# Patient Record
Sex: Female | Born: 1935 | Race: Black or African American | Hispanic: No | Smoking: Former smoker
Health system: Southern US, Community
[De-identification: ages and names within clinical notes are randomized; demographics above are authoritative.]

## PROBLEM LIST (undated history)

## (undated) DIAGNOSIS — I509 Heart failure, unspecified: Secondary | ICD-10-CM

## (undated) DIAGNOSIS — E559 Vitamin D deficiency, unspecified: Secondary | ICD-10-CM

## (undated) DIAGNOSIS — E119 Type 2 diabetes mellitus without complications: Secondary | ICD-10-CM

## (undated) DIAGNOSIS — I10 Essential (primary) hypertension: Secondary | ICD-10-CM

## (undated) DIAGNOSIS — F419 Anxiety disorder, unspecified: Secondary | ICD-10-CM

## (undated) DIAGNOSIS — L8990 Pressure ulcer of unspecified site, unspecified stage: Secondary | ICD-10-CM

## (undated) DIAGNOSIS — E039 Hypothyroidism, unspecified: Secondary | ICD-10-CM

## (undated) DIAGNOSIS — C50919 Malignant neoplasm of unspecified site of unspecified female breast: Secondary | ICD-10-CM

## (undated) DIAGNOSIS — M1990 Unspecified osteoarthritis, unspecified site: Secondary | ICD-10-CM

## (undated) DIAGNOSIS — R350 Frequency of micturition: Secondary | ICD-10-CM

## (undated) HISTORY — DX: Vitamin D deficiency, unspecified: E55.9

## (undated) HISTORY — DX: Unspecified osteoarthritis, unspecified site: M19.90

## (undated) HISTORY — PX: ABDOMINAL HYSTERECTOMY: SHX81

## (undated) HISTORY — PX: CHOLECYSTECTOMY: SHX55

## (undated) HISTORY — PX: BREAST LUMPECTOMY: SHX2

## (undated) HISTORY — DX: Heart failure, unspecified: I50.9

## (undated) HISTORY — DX: Anxiety disorder, unspecified: F41.9

## (undated) HISTORY — PX: CATARACT EXTRACTION: SUR2

## (undated) HISTORY — DX: Hypothyroidism, unspecified: E03.9

## (undated) HISTORY — DX: Frequency of micturition: R35.0

## (undated) HISTORY — DX: Essential (primary) hypertension: I10

---

## 1997-09-13 ENCOUNTER — Ambulatory Visit (HOSPITAL_COMMUNITY): Admission: RE | Admit: 1997-09-13 | Discharge: 1997-09-13 | Payer: Self-pay | Admitting: Internal Medicine

## 1997-09-21 ENCOUNTER — Ambulatory Visit (HOSPITAL_COMMUNITY): Admission: RE | Admit: 1997-09-21 | Discharge: 1997-09-21 | Payer: Self-pay | Admitting: Internal Medicine

## 1997-10-06 ENCOUNTER — Ambulatory Visit (HOSPITAL_COMMUNITY): Admission: RE | Admit: 1997-10-06 | Discharge: 1997-10-06 | Payer: Self-pay | Admitting: General Surgery

## 1997-10-20 ENCOUNTER — Ambulatory Visit (HOSPITAL_COMMUNITY): Admission: RE | Admit: 1997-10-20 | Discharge: 1997-10-20 | Payer: Self-pay | Admitting: *Deleted

## 1997-10-27 ENCOUNTER — Ambulatory Visit (HOSPITAL_COMMUNITY): Admission: RE | Admit: 1997-10-27 | Discharge: 1997-10-27 | Payer: Self-pay | Admitting: *Deleted

## 1997-11-13 ENCOUNTER — Ambulatory Visit (HOSPITAL_COMMUNITY): Admission: RE | Admit: 1997-11-13 | Discharge: 1997-11-13 | Payer: Self-pay | Admitting: General Surgery

## 1997-12-04 ENCOUNTER — Encounter: Admission: RE | Admit: 1997-12-04 | Discharge: 1998-03-04 | Payer: Self-pay | Admitting: Radiation Oncology

## 1997-12-19 ENCOUNTER — Emergency Department (HOSPITAL_COMMUNITY): Admission: EM | Admit: 1997-12-19 | Discharge: 1997-12-19 | Payer: Self-pay | Admitting: *Deleted

## 1997-12-19 ENCOUNTER — Encounter: Payer: Self-pay | Admitting: *Deleted

## 1999-02-20 ENCOUNTER — Encounter: Payer: Self-pay | Admitting: Internal Medicine

## 1999-02-20 ENCOUNTER — Ambulatory Visit (HOSPITAL_COMMUNITY): Admission: RE | Admit: 1999-02-20 | Discharge: 1999-02-20 | Payer: Self-pay | Admitting: Internal Medicine

## 1999-06-14 ENCOUNTER — Ambulatory Visit (HOSPITAL_COMMUNITY): Admission: RE | Admit: 1999-06-14 | Discharge: 1999-06-14 | Payer: Self-pay | Admitting: Internal Medicine

## 1999-07-09 ENCOUNTER — Encounter: Admission: RE | Admit: 1999-07-09 | Discharge: 1999-07-09 | Payer: Self-pay | Admitting: Internal Medicine

## 1999-07-09 ENCOUNTER — Encounter: Payer: Self-pay | Admitting: Internal Medicine

## 1999-09-05 ENCOUNTER — Encounter: Payer: Self-pay | Admitting: Internal Medicine

## 1999-09-05 ENCOUNTER — Inpatient Hospital Stay (HOSPITAL_COMMUNITY): Admission: AD | Admit: 1999-09-05 | Discharge: 1999-09-10 | Payer: Self-pay | Admitting: Internal Medicine

## 1999-09-06 ENCOUNTER — Encounter: Payer: Self-pay | Admitting: Internal Medicine

## 1999-09-06 ENCOUNTER — Encounter: Payer: Self-pay | Admitting: Cardiovascular Disease

## 1999-09-08 ENCOUNTER — Encounter: Payer: Self-pay | Admitting: Internal Medicine

## 2000-01-29 ENCOUNTER — Encounter: Admission: RE | Admit: 2000-01-29 | Discharge: 2000-01-29 | Payer: Self-pay | Admitting: *Deleted

## 2000-01-29 ENCOUNTER — Encounter: Payer: Self-pay | Admitting: *Deleted

## 2000-07-27 ENCOUNTER — Ambulatory Visit (HOSPITAL_COMMUNITY): Admission: RE | Admit: 2000-07-27 | Discharge: 2000-07-27 | Payer: Self-pay | Admitting: *Deleted

## 2000-07-27 ENCOUNTER — Encounter: Payer: Self-pay | Admitting: *Deleted

## 2001-02-02 ENCOUNTER — Encounter: Payer: Self-pay | Admitting: Internal Medicine

## 2001-02-02 ENCOUNTER — Encounter: Admission: RE | Admit: 2001-02-02 | Discharge: 2001-02-02 | Payer: Self-pay | Admitting: Internal Medicine

## 2001-02-04 ENCOUNTER — Encounter: Admission: RE | Admit: 2001-02-04 | Discharge: 2001-02-04 | Payer: Self-pay | Admitting: Internal Medicine

## 2001-02-04 ENCOUNTER — Encounter: Payer: Self-pay | Admitting: Internal Medicine

## 2001-09-28 ENCOUNTER — Ambulatory Visit (HOSPITAL_COMMUNITY): Admission: RE | Admit: 2001-09-28 | Discharge: 2001-09-28 | Payer: Self-pay | Admitting: Oncology

## 2001-09-28 ENCOUNTER — Encounter (HOSPITAL_COMMUNITY): Payer: Self-pay | Admitting: Oncology

## 2002-01-10 ENCOUNTER — Encounter: Payer: Self-pay | Admitting: Cardiology

## 2002-01-10 ENCOUNTER — Ambulatory Visit (HOSPITAL_COMMUNITY): Admission: RE | Admit: 2002-01-10 | Discharge: 2002-01-10 | Payer: Self-pay | Admitting: Cardiology

## 2002-04-01 ENCOUNTER — Encounter: Admission: RE | Admit: 2002-04-01 | Discharge: 2002-04-01 | Payer: Self-pay | Admitting: Radiation Oncology

## 2002-05-12 ENCOUNTER — Encounter: Payer: Self-pay | Admitting: Emergency Medicine

## 2002-05-12 ENCOUNTER — Emergency Department (HOSPITAL_COMMUNITY): Admission: EM | Admit: 2002-05-12 | Discharge: 2002-05-12 | Payer: Self-pay | Admitting: Emergency Medicine

## 2003-04-03 ENCOUNTER — Ambulatory Visit: Admission: RE | Admit: 2003-04-03 | Discharge: 2003-04-03 | Payer: Self-pay | Admitting: Radiation Oncology

## 2003-07-07 ENCOUNTER — Encounter: Admission: RE | Admit: 2003-07-07 | Discharge: 2003-07-07 | Payer: Self-pay | Admitting: Internal Medicine

## 2003-08-15 ENCOUNTER — Emergency Department (HOSPITAL_COMMUNITY): Admission: EM | Admit: 2003-08-15 | Discharge: 2003-08-16 | Payer: Self-pay | Admitting: Emergency Medicine

## 2003-12-18 ENCOUNTER — Ambulatory Visit (HOSPITAL_COMMUNITY): Admission: RE | Admit: 2003-12-18 | Discharge: 2003-12-18 | Payer: Self-pay | Admitting: Orthopedic Surgery

## 2004-01-12 ENCOUNTER — Encounter: Admission: RE | Admit: 2004-01-12 | Discharge: 2004-01-12 | Payer: Self-pay | Admitting: Internal Medicine

## 2004-04-02 ENCOUNTER — Encounter: Admission: RE | Admit: 2004-04-02 | Discharge: 2004-04-02 | Payer: Self-pay | Admitting: General Surgery

## 2004-04-08 ENCOUNTER — Ambulatory Visit: Admission: RE | Admit: 2004-04-08 | Discharge: 2004-04-08 | Payer: Self-pay | Admitting: Radiation Oncology

## 2005-03-11 ENCOUNTER — Encounter: Payer: Self-pay | Admitting: Emergency Medicine

## 2005-03-12 ENCOUNTER — Inpatient Hospital Stay (HOSPITAL_COMMUNITY): Admission: AD | Admit: 2005-03-12 | Discharge: 2005-03-17 | Payer: Self-pay | Admitting: Orthopedic Surgery

## 2005-05-08 ENCOUNTER — Inpatient Hospital Stay (HOSPITAL_COMMUNITY): Admission: EM | Admit: 2005-05-08 | Discharge: 2005-05-21 | Payer: Self-pay | Admitting: Emergency Medicine

## 2005-09-23 IMAGING — CR DG CHEST 2V
2 series · 2 of 2 positions shown · non-contrast
Comparison: 2 view chest 07/07/03.

CLINICAL DATA: Chest pain, right upper extremity edema.  
 CHEST -  2 VIEWS ? 08/15/03

[view not recorded (1 of 2)]
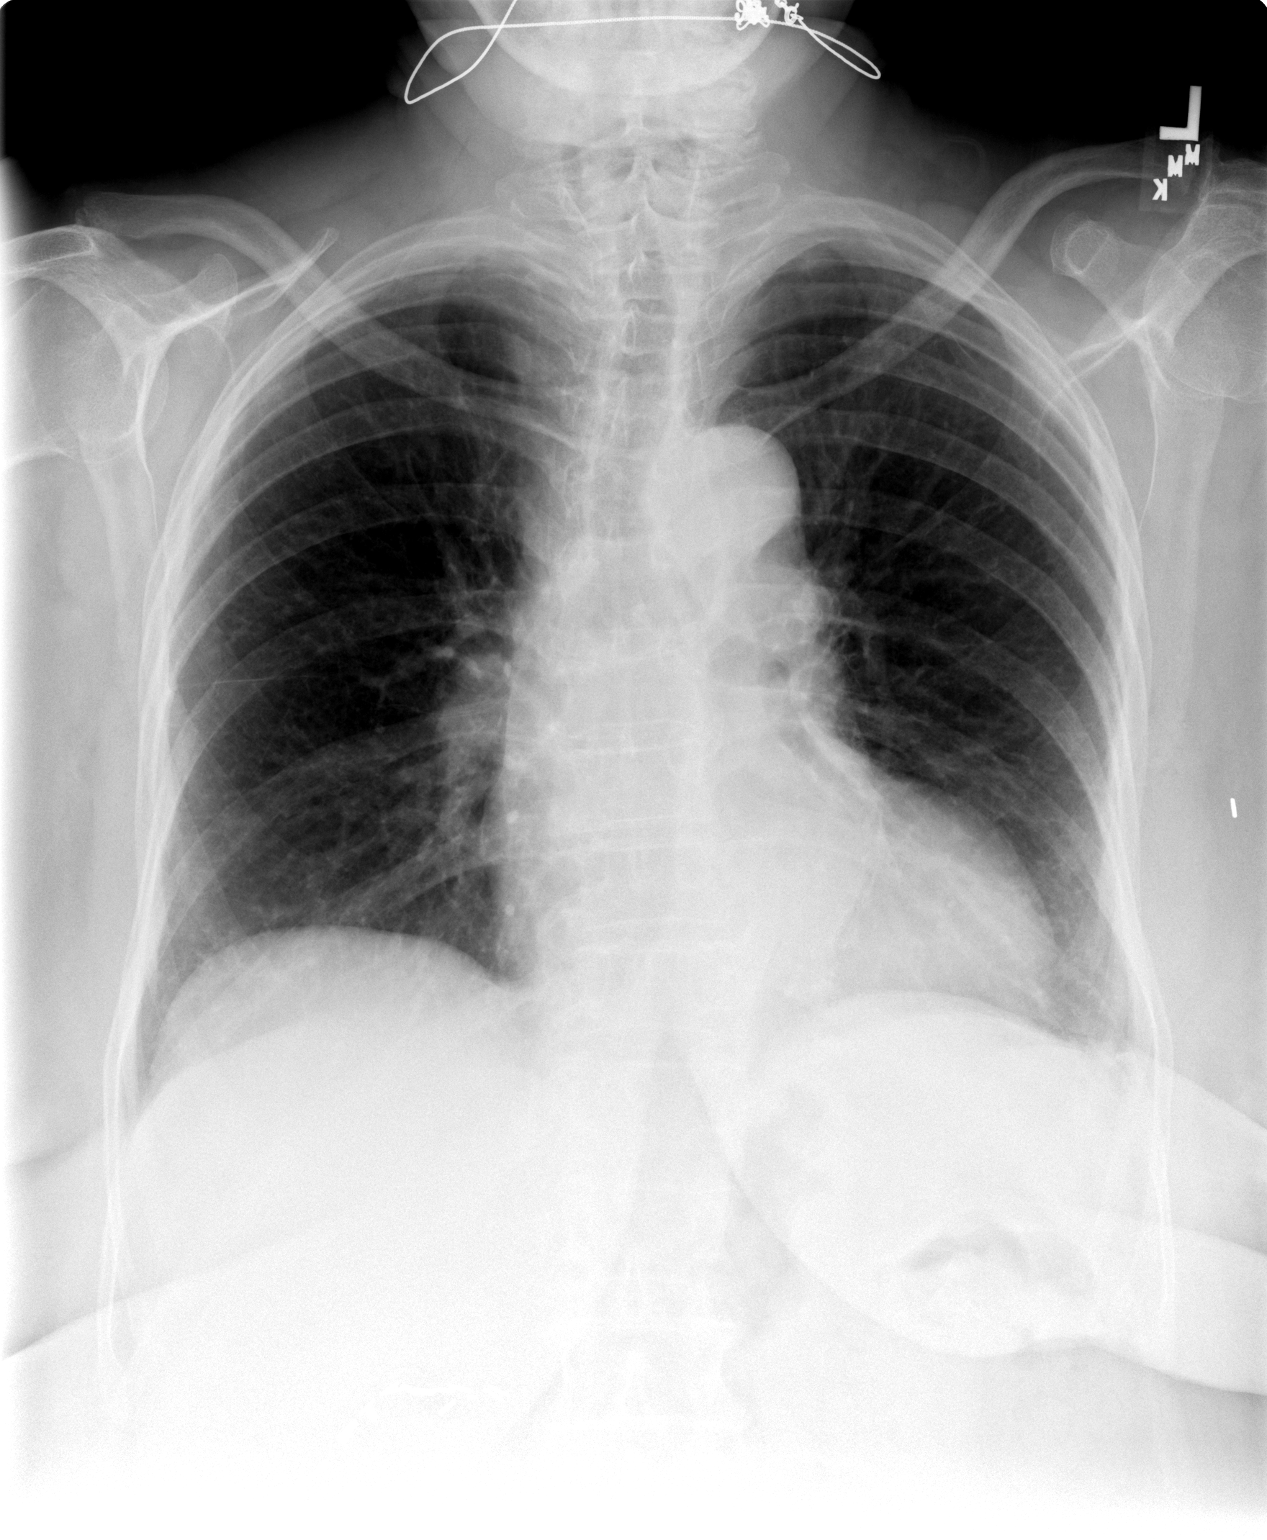

[view not recorded (2 of 2)]
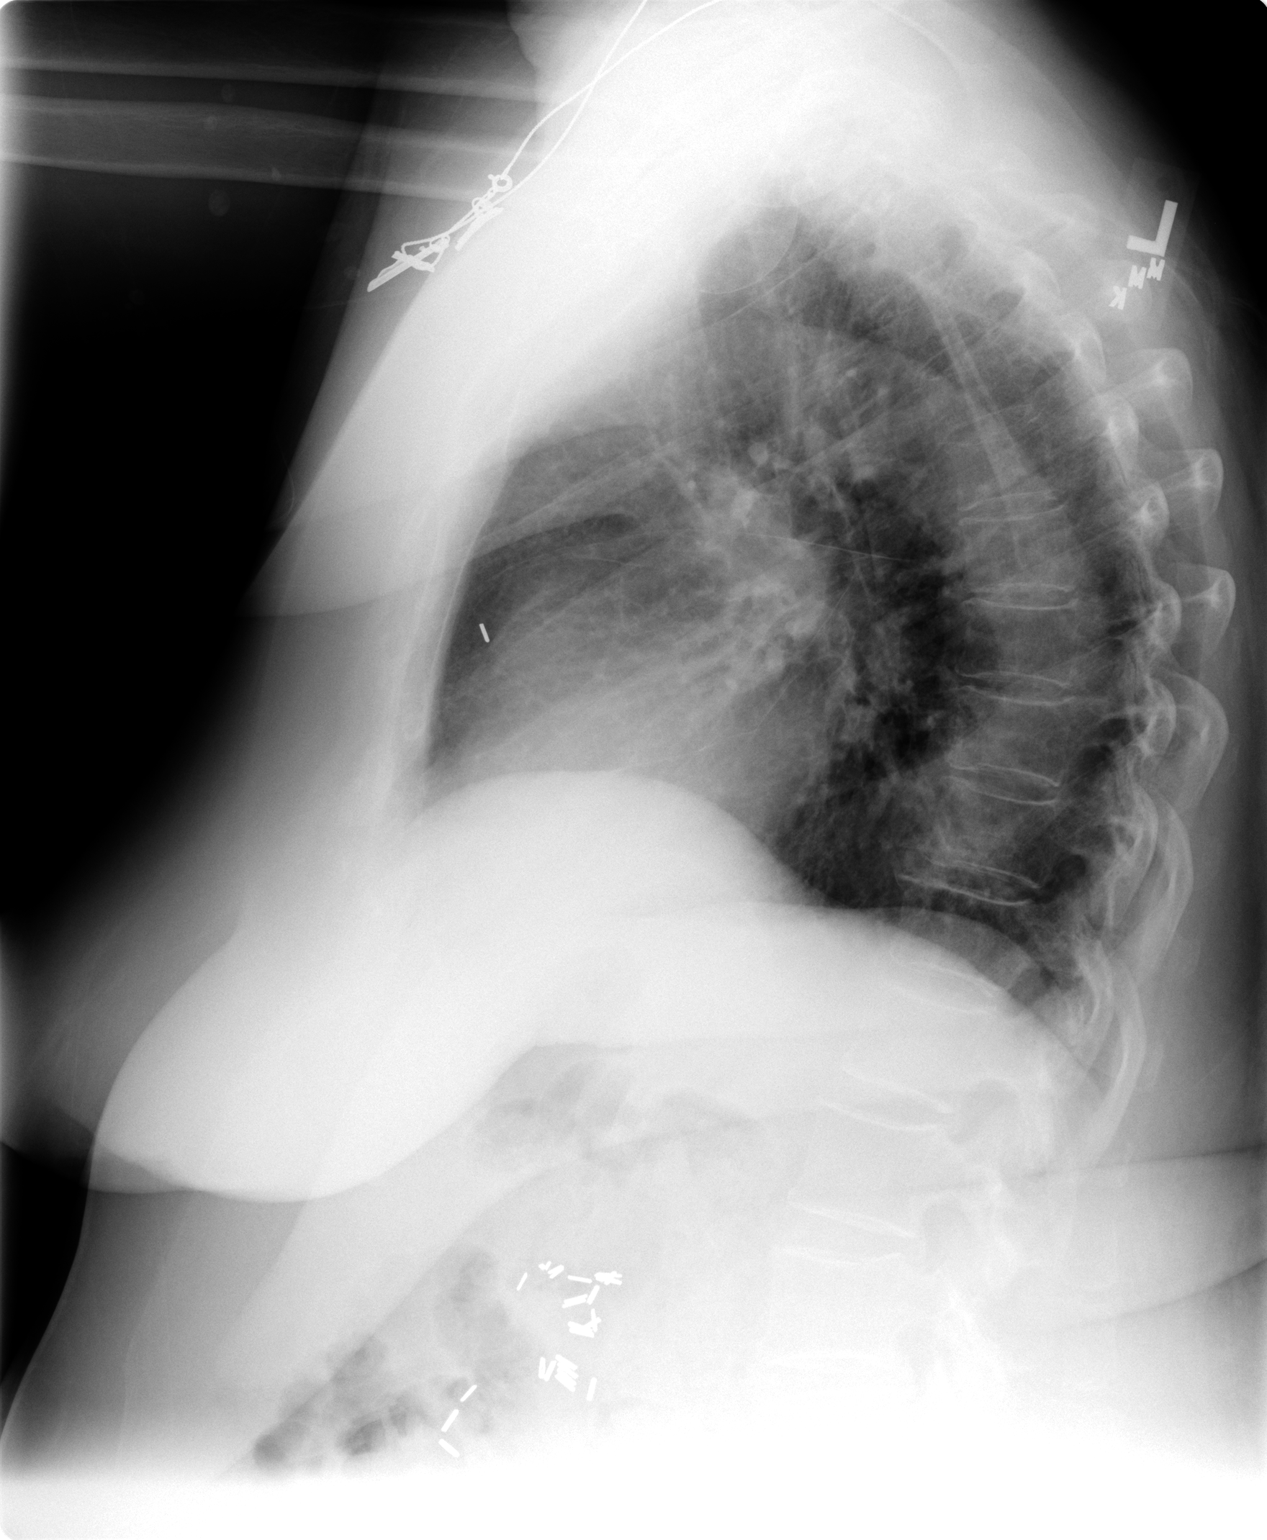

[2 of 2 positions shown; findings below may reference images not displayed]

The heart is mildly enlarged though stable.  The thoracic aorta is mildly tortuous and atherosclerotic though unchanged.  The hilar and mediastinal contours are otherwise unremarkable.  Scarring in the lower lobes is unchanged.  The lungs appear clear otherwise.  Mild degenerative changes are present in the thoracic spine.
 IMPRESSION
 Mild cardiomegaly.  No evidence of acute disease.

## 2006-01-22 ENCOUNTER — Encounter: Payer: Self-pay | Admitting: Vascular Surgery

## 2006-01-22 ENCOUNTER — Ambulatory Visit (HOSPITAL_COMMUNITY): Admission: RE | Admit: 2006-01-22 | Discharge: 2006-01-22 | Payer: Self-pay | Admitting: Internal Medicine

## 2006-05-19 ENCOUNTER — Inpatient Hospital Stay (HOSPITAL_COMMUNITY): Admission: AD | Admit: 2006-05-19 | Discharge: 2006-05-23 | Payer: Self-pay | Admitting: Internal Medicine

## 2008-04-11 ENCOUNTER — Emergency Department (HOSPITAL_COMMUNITY): Admission: EM | Admit: 2008-04-11 | Discharge: 2008-04-11 | Payer: Self-pay | Admitting: Emergency Medicine

## 2008-10-12 ENCOUNTER — Emergency Department (HOSPITAL_COMMUNITY): Admission: EM | Admit: 2008-10-12 | Discharge: 2008-10-12 | Payer: Self-pay | Admitting: Emergency Medicine

## 2009-11-06 ENCOUNTER — Encounter: Admission: RE | Admit: 2009-11-06 | Discharge: 2009-11-06 | Payer: Self-pay | Admitting: Internal Medicine

## 2010-07-16 ENCOUNTER — Inpatient Hospital Stay (HOSPITAL_BASED_OUTPATIENT_CLINIC_OR_DEPARTMENT_OTHER)
Admission: RE | Admit: 2010-07-16 | Discharge: 2010-07-16 | Disposition: A | Discharge status: Home / Self Care | Payer: Medicare Other | Source: Ambulatory Visit | Attending: Cardiology | Admitting: Cardiology

## 2010-07-16 DIAGNOSIS — R079 Chest pain, unspecified: Secondary | ICD-10-CM | POA: Insufficient documentation

## 2010-07-16 DIAGNOSIS — E039 Hypothyroidism, unspecified: Secondary | ICD-10-CM | POA: Insufficient documentation

## 2010-07-16 DIAGNOSIS — I10 Essential (primary) hypertension: Secondary | ICD-10-CM | POA: Insufficient documentation

## 2010-07-16 DIAGNOSIS — I509 Heart failure, unspecified: Secondary | ICD-10-CM | POA: Insufficient documentation

## 2010-07-16 DIAGNOSIS — I428 Other cardiomyopathies: Secondary | ICD-10-CM | POA: Insufficient documentation

## 2010-07-16 DIAGNOSIS — E7800 Pure hypercholesterolemia, unspecified: Secondary | ICD-10-CM | POA: Insufficient documentation

## 2010-07-16 DIAGNOSIS — D649 Anemia, unspecified: Secondary | ICD-10-CM | POA: Insufficient documentation

## 2010-07-16 DIAGNOSIS — I5020 Unspecified systolic (congestive) heart failure: Secondary | ICD-10-CM | POA: Insufficient documentation

## 2010-08-02 ENCOUNTER — Encounter: Payer: Self-pay | Admitting: Internal Medicine

## 2010-09-06 NOTE — H&P (Signed)
NAMEJASIYA, [name]                        ACCOUNT NO.:  [id number]   MEDICAL RECORD NO.:  [id number]                   PATIENT TYPE:  OIB   LOCATION:  NA                                   FACILITY:  [facility]   PHYSICIAN:  Artist Pais. Mina Marble, M.D.           DATE OF BIRTH:  November 21, 1915   DATE OF ADMISSION:  DATE OF DISCHARGE:                                HISTORY & PHYSICAL   PREOPERATIVE DIAGNOSES:  1. Right thumb metacarpophalangeal arthritis.  2. Right carpal tunnel syndrome.   POSTOPERATIVE DIAGNOSES:  1. Right thumb metacarpophalangeal arthritis.  2. Right carpal tunnel syndrome.   PROCEDURES:  1. Right thumb metacarpophalangeal fusion.  2. Right carpal tunnel release.   SURGEON:  Artist Pais. Mina Marble, M.D.   ASSISTANT:  Aura Fey. Bobbe Medico.   ANESTHESIA:  General.   TOURNIQUET TIME:  45 minutes.   No complications.  No drains.   OPERATIVE REPORT:  The patient was taken to the operating room, where after  the induction of adequate general anesthesia the right upper extremity was  prepped and draped in the usual sterile fashion.  An Esmarch was used to  exsanguinate the limb, and the tourniquet was inflated to 250 mmHg.  At this  point in time a C-shaped incision was made over the metacarpophalangeal  joint of the right thumb and a large radially-based flap was elevated.  The  interval between the EPL and EPB was identified and incised.  These tendons  were dissected off the capsule and retracted.  After this was done, a  longitudinal capsulotomy was performed.  The capsule was entered.  There  were large dorsal osteophytes that were removed with a rongeur, and the  proximal aspect of the proximal phalanx and the distal aspect of the  metacarpal head were decorticated down to cancellous bone.  At this point in  time an AccuTrac standard guidewire was placed across the thumb MP joint in  30 degrees of flexion, slight pronation, and a 27.5 mm standard AccuTrac  screw  was placed across the site to accomplish the fusion.  The wound was  thoroughly irrigated.  The capsule was closed with 4-0 Vicryl and the  extensor tendons were realigned using the same Vicryl suture, the skin with  running 3-0 Prolene subcuticular stitch.  At this point in time the hand was  fully supinated.  A second incision was made in the palmar aspect of the  right hand in line with the long finger metacarpal starting at Kaplan's  cardinal line.  The incision was taken down through the skin and  subcutaneous tissues.  The palmar fascia was identified and split.  The  transverse carpal ligament was identified and then incised at the distal  aspect and the median nerve was identified and protected, and the remaining  aspects of the transverse carpal ligament were divided  with the curve blunt scissors.  The canal was inspected.  There  were not  osteophytes or ganglions present, and it initially closed with 3-0 Prolene  subcuticular stitch.  Steri-Strips, 4 x 4's, fluffs, compressive dressing,  and a radial dorsal splint were applied.  The patient tolerated the  procedure well and went to the recovery room in stable fashion.                                                Artist Pais Mina Marble, M.D.    MAW/MEDQ  D:  12/18/2003  T:  12/18/2003  Job:  [id number]

## 2010-09-06 NOTE — H&P (Signed)
[name], [name]              ACCOUNT NO.:  [id number]   MEDICAL RECORD NO.:  [id number]          PATIENT TYPE:  INP   LOCATION:  3705                         FACILITY:  [facility]   PHYSICIAN:  Eric L. August Saucer, M.D.     DATE OF BIRTH:  Mar 23, 1916   DATE OF ADMISSION:  05/19/2006  DATE OF DISCHARGE:                              HISTORY & PHYSICAL   CHIEF COMPLAINT:  Progressive weakness with chest pressure and dyspnea  on exertion.   HISTORY OF PRESENT ILLNESS:  This is one of several multiple hospital  admissions for this 77 year old widowed black female with longstanding  history of hypertension, previous history congestive heart failure,  esophageal reflux, morbid obesity, with history of medication  noncompliance as well.  She was last seen in our office in October of  2007.  She had subsequently missed followup appointments.  She states,  however, that she had been feeling well up until the past few weeks.  The patient had noted increasing exertional dyspnea.  This is associated  with vague, substernal chest discomfort.  This would be associated with  shortness of breath, but without diaphoresis.  She has also during this  time had episodic nausea and vomiting without significant abdominal  pain.  It is unclear how well the patient has been taking her  medications.  Documentation with the pharmacy shows that she had skipped  several of her cardiac medications, as well as her thyroid medications.  She has also had some urinary frequency without frank dysuria.  She  complains of occasional fever and chills and night sweats.  Today she  was seen in our office after being brought in by her daughter because of  progressive weakness.  She was subsequently admitted to the hospital for  further evaluation.   PAST MEDICAL HISTORY:  As noted above.  She has a history of  transcerebral ischemia in the past.  Documented congestive heart failure  in the past, organic brain syndrome, past  history of breast CA,  hypothyroidism as noted.  History of depression.  History of esophageal  reflux.   PAST SURGICAL HISTORY:  She is status post cholecystectomy,  hysterectomy.  Status post breast tumor in the past as well.  Status  post appendectomy as well as cataract surgery.   SOCIAL HISTORY:  The patient is widowed.  Lives in her own home.  She  has close attendance by her multiple family members.   ALLERGIES:  THE PATIENT IS ALLERGIC TO CODEINE.   PRESENT MEDICATIONS:  1. Coreg 3.125 mg p.o. q. 12 hours.  2. Synthroid 125 mcg p.o. daily.  3. Aggrenox one capsule b.i.d. (unclear if patient has been taking.)  4. Lisinopril 20 mg daily.  5. Prevacid 30 mg p.o. daily.  6. Xanax 0.25 mg t.i.d. p.r.n.  7. Celexa 10 mg p.o. daily (unclear if patient has been taking.)   PHYSICAL EXAMINATION:  She is a well-developed, obese, black female  appearing extremely weak.  Height 5 feet 2 inches, weight 104 kilograms.  Blood pressure is 137/96, heart rate of 96, respiratory rate 20,  temperature of 99.  O2 sats of 96% initially on room air.  HEENT:  Head normocephalic, atraumatic.  Without bruits.  Extraocular  muscles are intact.  There is no sinus tenderness.  Eyes are nonicteric.  TMs are notable for a decreased light reflex without erythematous  changes.  Throat:  Fair dentition.  Posterior oropharynx is clear.  NECK:  Supple without enlarged thyroid.  No posterior cervical nodes.  LUNGS:  Clear with few left basilar crackles.  No wheezes appreciated.  No E to A changes.  CARDIOVASCULAR:  Shows normal S1, S2.  No S3 appreciated.  She has a 1/6  systolic ejection murmur in the lower left sternal border.  ABDOMEN:  Distended.  Tympanic to percussion.  Dullness in the right  lower quadrant.  She had mild right CVA tenderness.  No suprapubic  tenderness appreciated.  MUSCULOSKELETAL:  Full range of motion in upper extremities.  She has  crepitus in the knees.  Trace to 1+ edema in the  lower extremities.  Negative Homan.  NEUROLOGIC:  She is alert and oriented x3.  Cranial nerves are intact.  Strength is symmetric.  SKIN:  Without active lesions.   LABORATORY DATA:  CBC revealed a WBC of 6,100, hemoglobin 14.2,  hematocrit of 43.8.  Chemistries:  Sodium 141, potassium 4.1, chloride  106, CO2 26, BUN of 10, creatinine 0.86, glucose of 138.  SGOT, SGPT  within normal limits.  Albumin 3.5.  CPK of 111, CK-MB of 1.6, troponin  0.03.  Calcium 9.6.  BNP 129.   Chest x-ray and abdominal films still pending.  Urinalysis pending as  well.   EKG:  Abnormal with normal sinus rhythm, left bundle branch block.  Possible lateral infarct, age undetermined.   IMPRESSION:  1. Progressive weakness with chest pain of questionable etiology.      Rule out coronary artery disease versus other.  Her BNP presently      is only mildly elevated which goes against severe congestive heart      failure at this time.  2. Abnormal EKG.  Rule out coronary artery disease.  3. Deconditioning secondary to the above.  4. Hypertension.  5. History of cerebrovascular disease with lacunar infarcts.  6. History of esophageal reflux.  She has had episodic nausea and      vomiting as well.  This will need further evaluation.  7. Degenerative joint disease.  8. Questionable safe home environment.   PLAN:  The patient is admitted for further evaluation at this time.  Obtain cardiac enzymes.  Rule out recent injury.  Chest x-ray, PA and  lateral, and abdominal films have been ordered.  Follow up on urinalysis  as well.  Cardiological consultation with Dr. Sharyn Lull who has seen the  patient in the past as well.  We will treat for acute coronary  insufficiency at this time.  Further followup pending the results of the  above.  Will monitor the patient's blood sugar as well with A1c and close followup of her sugar as well hospitalized.  Further therapy  pending response to the above.  Cardiac medicine will  be adjusted per  cardiology.  Further evaluation and recommendations after initial  cardiac evaluation.           ______________________________  Lind Guest August Saucer, M.D.    ELD/MEDQ  D:  05/19/2006  T:  05/20/2006  Job:  [id number]

## 2010-09-06 NOTE — H&P (Signed)
[employer]  Patient:    [name], [name]                     MRN: [id number] Adm. Date:  [id number] Attending:  Gwenyth Bender                         History and Physical  CHIEF COMPLAINT: Increasing right leg pain and swelling.  HISTORY OF PRESENT ILLNESS: The patient is an 77 year old widowed black female who presented to our office complaining of increasing right leg pain over the past two weeks duration.  She noted increasing pain with ambulation as well. The leg was initially quite edematous, with discomfort with ambulation.  Over subsequent days she has noted persistent swelling.  There has been no associated chest pain presently.  She was seen in the office and had suggestion of DVT.  She is subsequently admitted for further evaluation.  The patient denies recent trauma to the legs.  She denies significant dietary indiscretion; i.s., any salt or sweets in the diet.  She notably approximately one month ago had experienced intermittent chest pain.  This was a dull pressure sensation with no exertional dyspnea.  She states this pain subsequently did subside spontaneously.  She notably denies orthopnea.  She did have some mild exertional dyspnea with marked activity.  REVIEW OF SYSTEMS: As noted above.  She has occasional dysesthesias in the feet, presently having significant pain in the right ankle with ambulation as well.  PAST MEDICAL HISTORY:  1. History of left breast carcinoma.  2. Long-standing morbid obesity.  3. Degenerative joint disease.  4. Anxiety disorder.  5. Long-standing arthritis in the knees, right greater than left.  6. History of early cataracts.  PAST SURGICAL HISTORY:  1. Status post left breast lumpectomy two years ago by Dr. Lurene Shadow.  2. Status post cholecystectomy.  3. Status post hysterectomy and appendectomy greater than 20 years ago.  SOCIAL HISTORY: The patient does not smoke or drink.  The patient lives in  her own home.  She has support from her daughters.  ALLERGIES: CODEINE.  CURRENT MEDICATIONS:  1. Restoril 30 mg p.o. q.h.s.  2. Xanax 0.25 mg p.o. t.i.d.  3. Prevacid 30 mg p.o. q.h.s.  4. She has been on Reglan 10 mg p.o. a.c and h.s. in the past which she     has been taking sporadically.  5. Tamoxifen 10 mg 2 p.o. q.d.  6. Glucosamine OTC for arthritis symptoms.  7. Vioxx in the past.  PHYSICAL EXAMINATION:  GENERAL: Well-developed, obese black female, presently in no acute distress.  VITAL SIGNS: Height 5 feet 2 inches.  Weight 240 pounds.  Blood pressure 134/64, pulse 88, respirations 24, temperature 99.2 degrees.  HEENT: Head normocephalic, atraumatic without bruits.  EOMI.  Early cataracts bilaterally, grade 1 changes.  No sinus tenderness.  She had mild turbinate edema bilaterally without occlusions.  TMs clear.  Throat shows posterior pharynx clear.  NECK: Supple without enlarged thyroid.  No posterior cervical nodes.  No carotid bruits.  LUNGS: Diminished breath sounds at the bases.  No wheezes or rales appreciated.  CARDIOVASCULAR: Normal S1 and S2.  No S3 or S4.  There is a 1/6 systolic ejection murmur at the lower left sternal border.  ABDOMEN: Minimal epigastric tenderness.  No enlarged liver or spleen, no masses or tenderness.  EXTREMITIES: Notable for the right lower extremity.  She has 3+ pitting edema. The  leg is firm with positive Homans.  Notably the leg is cool versus warm. The left leg is notable for 1+ pitting edema with negative Homans.  She has trace dorsalis pedis pulses bilaterally.  There is tenderness to palpation along the medial aspect of the right knee.  Negative meniscus sign.  Left leg is intact.  NEUROLOGIC: She is alert and oriented x 3.  Cranial nerves intact.  Strength grossly intact as well.  Mildly decreased sensation in the right foot versus left.  LABORATORY DATA: CBC revealed a WBC of 6700, hemoglobin 13.3, hematocrit  40.5; 66% polys, 26% lymphocytes.  Electrolytes showed a sodium of 141, potassium 4.5, chloride 104, CO2 30, BUN 13, creatinine 1.0, glucose 120.  SGOT/SGPT within normal limits.  Albumin 3.9.  Calcium 9.5.  Initial CK 122, MB 0.7. Troponin was less than 0.03.  EKG reveals a normal sinus rhythm with left axis deviation.  She has evidence for inferior MI with evidence for anterolateral infarct as well, age indeterminate.  IMPRESSION:  1. Right leg pain and edema, rule out deep vein thrombosis.  2. Lower extremity edema secondary to venous insufficiency, rule out other.  3. Abnormal electrocardiogram, rule out silent ischemia.  Patient with     evidence for previous inferior infarction and possible anterolateral     infarction.  Presently she is without symptoms.  4. Obesity.  5. Reflux symptoms secondary to hiatal hernia.  6. Status post breast carcinoma with lumpectomy.  PLAN: Admit the patient for 23 hour observation.  Venous Dopplers of the lower extremities to exclude DVT.  Elevate the right leg at this time.  Diuretic therapy pending results of the Dopplers.  Will screen for recent myocardial injury.  A 2D echocardiogram will be obtained.  Cardiology evaluation as the patient has had intermittent anginal symptoms.  Further therapy pending response to the above. DD:  09/05/99 TD:  09/06/99 Job: [id number]

## 2010-09-06 NOTE — Discharge Summary (Signed)
NAMEDYLIN, IHNEN              ACCOUNT NO.:  [id number]   MEDICAL RECORD NO.:  [id number]          PATIENT TYPE:  INP   LOCATION:  2015                         FACILITY:  [facility]   PHYSICIAN:  Eric L. August Saucer, M.D.     DATE OF BIRTH:  07-29-1915   DATE OF ADMISSION:  03/12/2005  DATE OF DISCHARGE:  03/17/2005                                 DISCHARGE SUMMARY   FINAL DIAGNOSES:  1.  Lumbosacral spondylosis, 71.3.  2.  Congestive heart failure, 42810.  3.  Retention of urine, 788.20.  4.  Pulmonary collapse, 51810.  5.  Protein caloric malnutrition, 263.9.  6.  Hypertension, 401.9.  7.  Morbid obesity, 278.01.  8.  Osteoarthrosis, 715.36.  9.  History of breast malignancy, V10.3.  10. Hypothyroidism, 244.9.  11. Diabetes mellitus type 2, 520.00.  12. Depressive disorder, 311.  13. Debility, 799.3.  14. Organic brain syndrome 294.8.   OPERATION/PROCEDURE:  None.   HISTORY OF PRESENT ILLNESS:  This was a first recent [facility] hospital  admission for this 77 year old widowed black female with a one-week history  of recurrent falls with progressive weakness and unsteady gait.  The patient  notes occasional palpitations with these episodes as well.  She denied chest  pains or dyspnea.  The family had suspected that the patient had lost  consciousness on two of these occasions.  The patient has a history of  hypertension, morbid obesity, degenerative joint disease, involving the  knees and back.   PAST MEDICAL HISTORY:  Positive for mild congestive heart failure as well.   On the day of admission, the patient was unable to walk with increasing left  leg weakness.  The patient was subsequently brought to the emergency room by  her family for evaluation.   Remarkable for left breast CA with lumpectomy, morbid obesity,  hypothyroidism, and congestive heart failure.   MEDICATIONS:  At the time of admission, consisted of Xanax 0.5 mg p.o.  t.i.d., Ambien 10 mg q.h.s. p.r.n.  sleep, Ditropan 5 mg q. Day, Coreg 3.125  mg p.o. b.i.d., Levothroid 0.125 mg p.o. daily, lisinopril 20 mg q. Day,  amitriptyline 25 mg q.h.s., metoclopramide 5 mg p.o. a.c. and h.s., and  Norvasc 2.5 mg p.o. q. Day.  She was also taking Citalopram 5 mg p.o. q.  Day, Prevacid 30 mg q. Day, and Darvocet q.4 hours p.r.n. pain.   PHYSICAL EXAMINATION AND LABORATORY DATA:  Per admission H&P.   HOSPITAL COURSE:  The patient was admitted to telemetry for further  evaluation of unsteady gait with a history of falls.  There was concern for  a possible head injury and presenting symptoms.  A CT scan of the head was  obtained, which showed no evidence of a subdural.  In view of her lower back  symptoms and leg symptoms, an MRI scan of the lower back was done, which  demonstrated severe lumbar spondylolisthesis, with changes with muscular  system with spinal stenosis.  Notably, chemistry laboratory data was  remarkable for a normal BNP at 83.  In lieu of the patient's multiple  histories,  she was started on a Medrol Dosepak for possible avoidance of  surgical intervention.  This did cause some mild elevation of the blood  sugars as well, which was covered with sliding scale insulin.  Notably, she  was seen by physical therapy.  The patient was very slow to proceed with  weight bearing.  However, with continued Medrol therapy, her back symptoms  did improve with gradual participation in her physical therapy.  She did  have transient problem with mild confusion, notably at night.  Her  medications were adjusted accordingly.   She was also noted to have a low grade temperature.  Urinalysis and chest x-  ray did not show a definite site of infection.  She was initially covered  with antibiotics, which was subsequently discontinued.   Upon recurring evaluation, it was felt that the patient would benefit from  skilled nursing.  However, with continued physical therapy, the patient did  make significant  enough improvement that efforts were directed toward the  patient having physical therapy at home.  By March 17, 2005, the patient  was feeling better.  She did participate better in physical therapy with  improved mobility.  It was felt that she did deserve a home trial prior to  any further arrangements being made.   The patient was subsequently discharged home, improved.   DISCHARGE MEDICATIONS:  Consisted of Coreg 3.125 mg b.i.d., Citalopram 10 mg  q. Day, Centrum Silver one daily, Levothroid 100 mcg daily, Norvasc 2.5 mg  q. Day, metoclopramide 5 mg p.o. a.c. and h.s., lisinopril 20 mg p.o. q.  Day, Ditropan 5 mg p.o. q. Day, alprazolam 0.25 mg p.o. t.i.d. p.r.n.  anxiety, Prevacid 30 mg p.o. q. day, and __________ APAP one q.4-6 hours  p.r.n. severe pain.   The patient will be seen in the office in two weeks' time.  She will be  maintained on a 4-gram sodium, no concentrated sweets diet.           ______________________________  Lind Guest. August Saucer, M.D.     ELD/MEDQ  D:  06/04/2005  T:  06/05/2005  Job:  [id number]

## 2010-09-06 NOTE — Discharge Summary (Signed)
[place]. [facility]  Patient:    [name], [name]                     MRN: [id number] Adm. Date:  [id number] Disc. Date: [id number] Attending:  Gwenyth Bender                           Discharge Summary  FINAL DIAGNOSES:  1. Congestive heart failure.  2. Paroxysmal ventricular tachycardia.  3. Venous insufficiency.  4. Osteoarthropathy of the lower legs.  5. Chondrocalcinosis.  6. Coronary atherosclerosis of the native coronary vessels.  7. Obesity, morbid.  8. Hypertension.  9. Hypothyroidism. 10. History of breast carcinoma, stable.  OPERATIONS/PROCEDURES:  Adenosine Cardiolite study.  HISTORY OF PRESENT ILLNESS:  The patient is an 77 year old widowed black female who presented to the office complaining of increasing right leg pain over the past two weeks duration.  The patient had noted pain with ambulation as well.  The leg was initially quite edematous with discomfort on ambulation. Over the subsequent days she had noted persistent swelling as well.  There has been no associated chest pain presently.  The patient was seen in the office and had suggestion of DVT.  She was subsequently admitted for further evaluation.  The patient denies recent trauma to the legs.  No significant dietary indiscretions.  She, notably, one month ago had experienced intermittent chest pain.  This was dull, pressure-type sensation with no exertional dyspnea.  States the pain subsequently did subside spontaneously. She, however, notes ongoing problems with exertional dyspnea.  PAST MEDICAL HISTORY:  As per admission H&P.  PHYSICAL EXAMINATION:  As per admission H&P.  HOSPITAL COURSE:  The patient was admitted for further evaluation of right leg pain, question of DVT, with significant lower extremity edema.  She was also noted to have an abnormal EKG, with a question of silent ischemia as well. She was admitted initially for observation.  Venous Dopplers of the  lower extremities were obtained which, notably, showed no evidence for DVT.  She was thereafter also started on diuretic therapy for venous insufficiency.  There was some evidence to suggest early congestive failure.  The patient, on further questioning, did relate the history of chest pain.  She was subsequently seen in consultation by Dr. Algie Coffer of cardiology.  He had Dr. Sharyn Lull follow up with the patient thereafter.  She did undergo an adenosine stress test which was read as being negative.  She, notably, did have bouts of nonsustained ventricular tachycardia during her hospital stay. This required further medication adjustment.  Over the subsequent days, the patient made steady improvement.  Her lower extremity edema did resolve with diuretic therapy.  She was noted to have some reflux-type symptoms as well.  This appears to have been demonstrated.  This was adjusted with her medical regimen as well.  With continued supportive measures, the patient made steady gains.  She, notably, still complained of pain in the right knee with ambulation.  She was subsequently seen in follow-up by Dr. Priscille Kluver.  She was given an injection of Kenalog with Marcaine.  She tolerated this well.  By Sep 10, 1999, she was felt to be stable for discharge.  DISCHARGE MEDICATIONS: 1. Altace 2.5 mg b.i.d. 2. Synthroid 25 mcg q.d. 3. Norvasc 2.5 mg q.d. 4. Tamoxifen 10 mg 2 q.d. 5. Prevacid 30 mg q.h.s. 6. Celebrex 200 mg q.d. 7. Reglan 10 mg p.o. a.c. and h.s.  8. Toprol XL 25 mg q.d.  DIET:  She will be maintained on a 4 g sodium diet.  DISCHARGE INSTRUCTIONS:  She is to use a walker with ambulation to prevent falls.  FOLLOW-UP:  She is to be seen in our office in two weeks time for follow-up. DD:  10/16/99 TD:  10/17/99 Job: [id number]

## 2010-09-06 NOTE — Discharge Summary (Signed)
[name], [name]              ACCOUNT NO.:  [id number]   MEDICAL RECORD NO.:  [id number]          PATIENT TYPE:  INP   LOCATION:  3705                         FACILITY:  [facility]   PHYSICIAN:  Eric L. August Saucer, M.D.     DATE OF BIRTH:  [birth year]   DATE OF ADMISSION:  05/19/2006  DATE OF DISCHARGE:  05/23/2006                               DISCHARGE SUMMARY   FINAL DIAGNOSES:  1. Hypertensive heart disease with congestive heart failure 42.91.  2. Congestive heart failure 428.0.  3. Esophageal reflux 530.81.  4. Morbid obesity 278.01.  5. History of past noncompliance 315.81.  6. 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  7. History of breast malignancy 310.3.  8. Hypothyroidism 244.9.  9. Depressive disorder 311.  10.Left bundle branch block 426.3.  11.Debility 799.3.  12.History of transient ischemic attack 812.54.  13.Osteoarthrosis 715.90.  14.Primary cardiomyopathy 425.4.   OPERATION/PROCEDURE:  None.   HISTORY OF PRESENT ILLNESS:  This was one of several hospital admissions  for this 77 year old widowed black female with long-standing  hypertension, history of congestive heart failure, esophageal reflux  disease, morbid obesity, and medication noncompliance.  The patient was  last seen in office in October 2007.  She had subsequently missed  followup appointments.  She states however, that she had been feeling  well until the past few weeks.  The patient had been noted to have  increased exertion of dyspnea.  This was associated with vague  substernal chest discomfort as well.  There was no associated  diaphoresis.  The patient has also, during this time, had episodic  nausea and vomiting without significant abdominal pain.  It has been  unclear as to how well the patient has been taking her medications.  Documentation from the pharmacy shows that patient has several cardiac  medications as well as a thyroid medication.  She is also complaining of  urinary frequency without  dysuria.  There has been occasional fever and  chills and night sweats.  The day of admission, the patient was seen at  the office, brought in by her daughter because of progressed weakness.  She was subsequently admitted to the hospital for further evaluation.   PAST MEDICAL HISTORY:  Past medical history and physical examination is  per admitted H&P.   HOSPITAL COURSE:  The patient was admitted for further evaluation of  progressive weakness with question of possible anginal equivalent versus  other.  She is also noted to have progressive deconditioning with  associated hypertension, esophageal reflux disease as noted.  The  patient was admitted for telemetry initially.  Cardiac enzymes were  obtained q.8 hours x3, which were negative for recent injury.  On  further questioning, the patient acknowledged that she had experienced  some chest pains intermittently.  She subsequently was seen in  consultation by Dr. Sharyn Lull of cardiology.  Her cardiac medications were  adjusted to maximize her heart failure management.  She subsequently  also underwent a Persantine Myoview study.  She notably did have chest  pain with this study; however, the study itself showed no acute  ischemia.  She was  found to have an old apical effect consistent with a  previous injury.  Her ejection fraction was approximately 30%.   This patient thereafter underwent further adjustments of medications,  which she tolerated well.  There was strong emphasis on patient  compliance.  Her thyroid medication as well as an antidepressant  medication were restarted.  The patient did feel better thereafter and  began ambulating.  Evaluation of a possible rehab placement was  considered, but patient showed significant motivation to perform more  independently.  By May 23, 2006, she was felt to be stable for  discharge.   MEDICATIONS:  At the time of discharge consisted of:  1. Lisinopril 10 mg b.i.d.  2. Coreg 3.125  mg b.i.d.  3. Aspirin 81 mg q.d.  4. Aggrenox 1 capsule b.i.d.  5. Protonix 40 mg b.i.d.  6. Xanax 0.25 mg b.i.d.  7. Levothyroxine 125 mcg daily.  8. Skelaxin 10 mg p.o. q.d.   DIET:  The patient will be maintained on a 4 gram sodium heart healthy  diet.   ACTIVITY:  She is to gradually increase activity slowly.   FOLLOWUP:  She will need followup evaluation with Dr. August Saucer in 2 weeks  time.  Followup evaluation with Dr. Sharyn Lull as well.           ______________________________  Lind Guest. August Saucer, M.D.     ELD/MEDQ  D:  07/08/2006  T:  07/09/2006  Job:  [id number]

## 2010-09-06 NOTE — Discharge Summary (Signed)
[name], [name]              ACCOUNT NO.:  [id number]   MEDICAL RECORD NO.:  [id number]          PATIENT TYPE:  INP   LOCATION:  4729                         FACILITY:  [facility]   PHYSICIAN:  Eric L. August Saucer, M.D.     DATE OF BIRTH:  [birth year]   DATE OF ADMISSION:  05/08/2005  DATE OF DISCHARGE:  05/21/2005                                 DISCHARGE SUMMARY   FINAL DIAGNOSES:  1.  Transient cerebral ischemia. 435.9.  2.  Congestive heart failure.  428.0.  3.  Protein caloric malnutrition.  263.9.  4.  Urinary tract infection.  599.0.  5.  Morbid obesity.  278.01.  6.  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  7.  History of breast malignancy.  310.3.  8.  Hypothyroidism.  244.9.  9.  Depressive disorder.  311.  10. History of fall.  315.88.  11. Lumbar spondylosis.  721.3.  12. Urinary incontinence.  788.30.  13. Osteoarthrosis.  715.36.  14. Otitis media.  32.9.  15. Esophageal reflux.  530.81.   OPERATIONS AND PROCEDURES:  1.  Injection into joints by Dr. Adair Laundry.  2.  Steroid injection.   HISTORY OF PRESENT ILLNESS:  This is one of multiple [facility]  admissions for this 77 year old widowed black female presenting with  transient slurred speech, increased weakness with recurrent falls.  Patient  has a history of congestive heart failure, morbid obesity, dementia,  lumbosacral spondylosis.  Patient had been living at home with outpatient  physical therapy.  Patient has not had 24/7 direct care, however.  Patient  attempts to get up without using her assist devices or using techniques per  physical therapy instructions.  The EMS has gone to the patient's house 5  times over the past weeks to assist with the patient getting off the floor.  On the day of admission, daughter noted that she had slurred speech with  increasing left-sided weakness.  Patient was brought to the emergency room  for further evaluation.   PAST MEDICAL HISTORY:  As per admission H&P.   HOSPITAL  COURSE:  The patient was admitted for further evaluation of  transient slurred speech with possible TIA.  She was noted by history of  unsteady gait with recurrent falls, as well.  History of congestive heart  failure, as well.  Patient was admitted to telemetry for further evaluation.  A CT scan at the time of admission demonstrated atrophy with old basal  ganglia infarcts.  Chest x-ray demonstrated mild cardiomegaly.  Patient was  admitted to telemetry, as noted.  She was placed on low-dose aspirin.  She  was started on neurological checks q.4h.  She was seen in consultation by  occupational therapy and physical therapy.  She, thereafter, underwent  intensive daily and/or every other day evaluation and treatment per  occupational therapy and physical therapy.  MRI scan of the brain showed no  acute abnormality with a remote right basal ganglia infarct.  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  She  also had x-rays of her knees to  assess her knee pains and instability.  This  demonstrated degenerative joint disease with a questionable loose body  involving the left knee.   While hospitalized, the patient was found on urinalysis to have evidence of  infection.  She was placed, initially, on Avelox and then subsequently  switched to Levaquin as a urine culture returned positive for enterococcus  infection.  Patient also was noted to have elevated bilirubin at the time of  presentation.  Abdominal ultrasound demonstrated a dilated common bile duct  with possible calculus.  A biliary scan, however, was performed which showed  no evidence for obstruction.  MRCP showed negative stones, as well.   Patient was continued with physical therapy.  She made slow progress.  The  issue regarding home care and unsafe home environment was explored with the  family.  Patient was seen in consultation by Dr. Adair Laundry who injected the  left knee, which she tolerated  well.  As the issue of home therapy versus  skilled nursing facility was discussed, patient became more motivated and  worked harder with physical therapy.  She gradually made more progress with  ambulation, as well.   While the patient made progress, vital signs were continually monitored.  She did have some transient drop in blood pressure, which required  adjustment of the blood pressure medication downward.  She was noted to have  significant deconditioning; however, this did gradually improve.  By May 20, 2005, she was progressing well.  It was felt that the patient would  continue with home physical therapy.  Upon extensive review, the family did  decide not to have patient go to skilled nursing facility, even for short-  term physical therapy.  Arrangements were subsequently made for the patient  to have home PT and OT.  She was discharged home on May 21, 2005,  feeling well.  Patient was semi-ambulatory, as well.  She was afebrile with  vital signs being stable.   Medications at the time of discharge consisted of Ditropan XL 10 mg daily,  Coreg 3.125 mg b.i.d., Reglan 5 mg a.c. and h.s., Celexa 10 mg daily,  Prevacid 30 mg daily, Aggrenox 1 capsule b.i.d., Centrum Silver 1 daily,  Synthroid 125 mcg daily, lisinopril 20 mg daily, Lexapro 5 mg q.h.s., Colace  100 mg b.i.d., alprazolam 0.25 mg t.i.d. p.r.n. anxiety.   Patient will be maintained on a 4-gram sodium diet.  She will also obtain  Ensure 1 can b.i.d. in between meals.   Followup in the office in the office in 2 weeks' time.  Close monitoring of  her progress.           ______________________________  Lind Guest August Saucer, M.D.     ELD/MEDQ  D:  07/16/2005  T:  07/18/2005  Job:  [id number]

## 2010-09-08 NOTE — Cardiovascular Report (Signed)
  NAMEElita Quick NO.:  [id number]  MEDICAL RECORD NO.:  [id number]           PATIENT TYPE:  LOCATION:                                 FACILITY:  PHYSICIAN:  Dyan Creelman N. Sharyn Lull, M.D.      DATE OF BIRTH:  DATE OF PROCEDURE:  07/16/2010 DATE OF DISCHARGE:                           CARDIAC CATHETERIZATION   __________     Eduardo Osier. Sharyn Lull, M.D.   ______________________________ Eduardo Osier. Sharyn Lull, M.D.    MNH/MEDQ  D:  07/16/2010  T:  07/16/2010  Job:  [id number]  Electronically Signed by Rinaldo Cloud M.D. on 09/08/2010 08:12:36 PM

## 2010-09-08 NOTE — Cardiovascular Report (Signed)
NAMEJASELYNN, TAMAS             ACCOUNT NO.:  [id number]  MEDICAL RECORD NO.:  [id number]          PATIENT TYPE:  LOCATION:                                 FACILITY:  PHYSICIAN:  Dare Sanger N. Sharyn Lull, M.D.      DATE OF BIRTH:  DATE OF PROCEDURE:  07/16/2010 DATE OF DISCHARGE:                           CARDIAC CATHETERIZATION   PROCEDURE:  Left cardiac cath with selective left and right coronary angiography, LV graphy via right groin using Judkins technique.  INDICATIONS FOR PROCEDURE:  Ms. Revak is a 77 year old black female with past medical history significant for hypertension, dilated cardiomyopathy, history of congestive heart failure secondary to systolic dysfunction, hypercholesteremia, hypothyroidism, GERD, history of CA of breast, complains of retrosternal chest pain off and on with minimal exertion without associated nausea, vomiting, or diaphoresis. Also, complains of tired feeling and weak and no energy.  Denies any palpitation, lightheadedness, or syncope.  Denies PND, orthopnea, leg swelling.  Denies any invasive cardiac workup in the past.  Denies any relation of chest pain to food, breathing, or movement.  PAST MEDICAL HISTORY:  As above.  PAST SURGICAL HISTORY: 1. She had cholecystectomy in the past. 2. Had hysterectomy in the past. 3. Appendectomy in the past. 4. Had cataract surgery in the past. 5. Had breast tumor resection in the past.  ALLERGIES:  She has intolerance to CODEINE.  MEDICATIONS AT HOME:  She is on: 1. Lisinopril 10 mg p.o. b.i.d. 2. Coreg 6.25 mg p.o. b.i.d. 3. Aldactone 25 mg p.o. daily. 4. Simvastatin 20 mg p.o. daily. 5. Enteric-coated aspirin 81 mg p.o. daily. 6. Levothyroxine 100 mcg daily.  SOCIAL HISTORY:  She is widowed, has 8 children.  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  No history of smoking or alcohol abuse.  FAMILY HISTORY:  Positive for cancer and coronary artery disease.  PHYSICAL EXAMINATION:   GENERAL:  She was alert, awake, oriented x3, in no acute distress. VITAL SIGNS:  Blood pressure was 130/80, pulse was 76 and regular. HEENT:  Conjunctivae were pink. NECK:  Supple.  No JVD, no bruit. LUNGS:  Clear to auscultation without rhonchi or rales. CARDIOVASCULAR:  S1, S2 was normal.  There was soft systolic murmur. There was no S3 gallop. ABDOMEN:  Soft.  Bowel sounds were present, nontender. EXTREMITIES:  There is no clubbing, cyanosis, or edema.  IMPRESSION: 1. Chest pain, rule out coronary insufficiency. 2. Dilated cardiomyopathy. 3. Compensated systolic heart failure. 4. Hypertension. 5. Hypercholesteremia. 6. Hypothyroidism. 7. Chronic anemia.  Discussed with the patient regarding left cath, its risks and benefits, i.e. death, MI, stroke, need for emergency CABG, local vascular complications, etc. and consented for the procedure.  PROCEDURE:  After obtaining the informed consent, the patient was brought to the cath lab and was placed on fluoroscopy table.  Right groin was prepped and draped in usual fashion.  Xylocaine 1% was used for local anesthesia in the right groin.  With the help of thin-wall needle, a 4-French arterial sheath was placed.  The sheath was aspirated and flushed.  Next, 4-French left Judkins catheter was advanced over the wire under fluoroscopic guidance up  to the ascending aorta.  Wire was pulled out, the catheter was aspirated and connected to the manifold. Catheter was further advanced and engaged into left coronary ostium. Multiple views of the left system were taken.  Next, the catheter was disengaged and was pulled out over the wire and was replaced with 4- [place] 3-D right diagnostic catheter which was advanced over the wire under fluoroscopic guidance up to the ascending aorta.  Wire was pulled out, the catheter was aspirated and connected to the manifold.  Catheter was further advanced and engaged into right coronary ostium.   Multiple views of the right system were taken.  Next, the catheter was disengaged and was pulled out over the wire and was replaced with 4-French pigtail catheter which was advanced over the wire under fluoroscopic guidance up to the ascending aorta.  Wire was pulled out, the catheter was aspirated and connected to the manifold.  Catheter was further advanced across the aortic valve into the LV.  LV pressures were recorded.  Next, LV graphy was done in 30-degree RAO position.  Post-angiographic pressures were recorded from LV and then pullback pressures were recorded from aorta. There was no gradient across the aortic valve.  Next, the pigtail catheter was pulled out over the wire.  Sheaths aspirated and flushed.  FINDINGS:  LV showed global hypokinesia, LVH EF of 40-45%.  Left main has 20-30% ostial stenosis which was more apparent in RAO caudal view. LAD has 10-15% ostial stenosis and 15-20% proximal stenosis.  Distally, the vessel is diffusely diseased.  Diagonal 1 and 2 were very small. Left circumflex was large which was patent.  OM1 and OM2 were moderate size which were patent.  RCA was patent.  PDA and PLV branches were small which were patent.  The patient tolerated the procedure well. There were no complications.  The patient was transferred to recovery room in stable condition.     Eduardo Osier. Sharyn Lull, M.D.    MNH/MEDQ  D:  08/22/2010  T:  08/22/2010  Job:  [id number]  Electronically Signed by Rinaldo Cloud M.D. on 09/08/2010 08:12:41 PM

## 2010-09-20 ENCOUNTER — Encounter (INDEPENDENT_AMBULATORY_CARE_PROVIDER_SITE_OTHER): Payer: Medicare Other

## 2010-09-20 DIAGNOSIS — M79609 Pain in unspecified limb: Secondary | ICD-10-CM

## 2011-01-24 LAB — POCT I-STAT, CHEM 8
BUN: 10 mg/dL (ref 6–23)
Chloride: 109 mEq/L (ref 96–112)
Creatinine, Ser: 0.6 mg/dL (ref 0.4–1.2)
Sodium: 142 mEq/L (ref 135–145)
TCO2: 21 mmol/L (ref 0–100)

## 2011-01-24 LAB — DIFFERENTIAL
Eosinophils Relative: 2 % (ref 0–5)
Lymphocytes Relative: 31 % (ref 12–46)
Lymphs Abs: 1.5 10*3/uL (ref 0.7–4.0)
Neutro Abs: 2.7 10*3/uL (ref 1.7–7.7)

## 2011-01-24 LAB — CBC
HCT: 39.8 % (ref 36.0–46.0)
Hemoglobin: 13.3 g/dL (ref 12.0–15.0)
Platelets: 203 10*3/uL (ref 150–400)
WBC: 4.9 10*3/uL (ref 4.0–10.5)

## 2011-03-29 ENCOUNTER — Other Ambulatory Visit: Payer: Self-pay | Admitting: Cardiology

## 2011-05-02 ENCOUNTER — Other Ambulatory Visit: Payer: Self-pay | Admitting: Cardiology

## 2011-05-29 ENCOUNTER — Ambulatory Visit
Admission: RE | Admit: 2011-05-29 | Discharge: 2011-05-29 | Disposition: A | Discharge status: Home / Self Care | Payer: Medicare Other | Source: Ambulatory Visit | Attending: Internal Medicine | Admitting: Internal Medicine

## 2011-05-29 ENCOUNTER — Other Ambulatory Visit: Payer: Self-pay | Admitting: Internal Medicine

## 2011-05-29 DIAGNOSIS — R0689 Other abnormalities of breathing: Secondary | ICD-10-CM

## 2012-01-23 ENCOUNTER — Other Ambulatory Visit: Payer: Self-pay | Admitting: Internal Medicine

## 2012-01-23 ENCOUNTER — Ambulatory Visit
Admission: RE | Admit: 2012-01-23 | Discharge: 2012-01-23 | Disposition: A | Discharge status: Home / Self Care | Payer: Medicare Other | Source: Ambulatory Visit | Attending: Internal Medicine | Admitting: Internal Medicine

## 2012-01-23 DIAGNOSIS — W19XXXA Unspecified fall, initial encounter: Secondary | ICD-10-CM

## 2012-05-27 ENCOUNTER — Encounter: Payer: Self-pay | Admitting: *Deleted

## 2012-06-01 ENCOUNTER — Encounter (HOSPITAL_COMMUNITY): Payer: Self-pay | Admitting: Neurology

## 2012-06-01 ENCOUNTER — Emergency Department (HOSPITAL_COMMUNITY): Payer: Medicare Other

## 2012-06-01 ENCOUNTER — Encounter (HOSPITAL_COMMUNITY): Payer: Self-pay | Admitting: *Deleted

## 2012-06-01 ENCOUNTER — Emergency Department (INDEPENDENT_AMBULATORY_CARE_PROVIDER_SITE_OTHER): Payer: Medicare Other

## 2012-06-01 ENCOUNTER — Emergency Department (HOSPITAL_COMMUNITY)
Admission: EM | Admit: 2012-06-01 | Discharge: 2012-06-01 | Disposition: A | Discharge status: Home / Self Care | Payer: Medicare Other | Attending: Emergency Medicine | Admitting: Emergency Medicine

## 2012-06-01 ENCOUNTER — Emergency Department (INDEPENDENT_AMBULATORY_CARE_PROVIDER_SITE_OTHER)
Admission: EM | Admit: 2012-06-01 | Discharge: 2012-06-01 | Disposition: A | Discharge status: Nursing Home (Includes ICF) | Payer: Medicare Other | Source: Home / Self Care | Attending: Family Medicine | Admitting: Family Medicine

## 2012-06-01 DIAGNOSIS — Z8639 Personal history of other endocrine, nutritional and metabolic disease: Secondary | ICD-10-CM | POA: Insufficient documentation

## 2012-06-01 DIAGNOSIS — Y939 Activity, unspecified: Secondary | ICD-10-CM | POA: Insufficient documentation

## 2012-06-01 DIAGNOSIS — S4980XA Other specified injuries of shoulder and upper arm, unspecified arm, initial encounter: Secondary | ICD-10-CM | POA: Insufficient documentation

## 2012-06-01 DIAGNOSIS — Z87891 Personal history of nicotine dependence: Secondary | ICD-10-CM | POA: Insufficient documentation

## 2012-06-01 DIAGNOSIS — E039 Hypothyroidism, unspecified: Secondary | ICD-10-CM | POA: Insufficient documentation

## 2012-06-01 DIAGNOSIS — Z862 Personal history of diseases of the blood and blood-forming organs and certain disorders involving the immune mechanism: Secondary | ICD-10-CM | POA: Insufficient documentation

## 2012-06-01 DIAGNOSIS — I6350 Cerebral infarction due to unspecified occlusion or stenosis of unspecified cerebral artery: Secondary | ICD-10-CM

## 2012-06-01 DIAGNOSIS — I639 Cerebral infarction, unspecified: Secondary | ICD-10-CM

## 2012-06-01 DIAGNOSIS — I10 Essential (primary) hypertension: Secondary | ICD-10-CM | POA: Insufficient documentation

## 2012-06-01 DIAGNOSIS — M79609 Pain in unspecified limb: Secondary | ICD-10-CM | POA: Insufficient documentation

## 2012-06-01 DIAGNOSIS — Z79899 Other long term (current) drug therapy: Secondary | ICD-10-CM | POA: Insufficient documentation

## 2012-06-01 DIAGNOSIS — M25511 Pain in right shoulder: Secondary | ICD-10-CM

## 2012-06-01 DIAGNOSIS — Z8739 Personal history of other diseases of the musculoskeletal system and connective tissue: Secondary | ICD-10-CM | POA: Insufficient documentation

## 2012-06-01 DIAGNOSIS — Y929 Unspecified place or not applicable: Secondary | ICD-10-CM | POA: Insufficient documentation

## 2012-06-01 DIAGNOSIS — Z87448 Personal history of other diseases of urinary system: Secondary | ICD-10-CM | POA: Insufficient documentation

## 2012-06-01 DIAGNOSIS — I509 Heart failure, unspecified: Secondary | ICD-10-CM | POA: Insufficient documentation

## 2012-06-01 DIAGNOSIS — R296 Repeated falls: Secondary | ICD-10-CM | POA: Insufficient documentation

## 2012-06-01 DIAGNOSIS — S46909A Unspecified injury of unspecified muscle, fascia and tendon at shoulder and upper arm level, unspecified arm, initial encounter: Secondary | ICD-10-CM | POA: Insufficient documentation

## 2012-06-01 LAB — CBC WITH DIFFERENTIAL/PLATELET
Basophils Absolute: 0 10*3/uL (ref 0.0–0.1)
Basophils Relative: 0 % (ref 0–1)
Eosinophils Absolute: 0.1 10*3/uL (ref 0.0–0.7)
Eosinophils Relative: 1 % (ref 0–5)
MCH: 31 pg (ref 26.0–34.0)
MCHC: 32.7 g/dL (ref 30.0–36.0)
MCV: 95 fL (ref 78.0–100.0)
Neutrophils Relative %: 69 % (ref 43–77)
Platelets: 275 10*3/uL (ref 150–400)
RBC: 4.19 MIL/uL (ref 3.87–5.11)
RDW: 12.9 % (ref 11.5–15.5)

## 2012-06-01 LAB — COMPREHENSIVE METABOLIC PANEL
ALT: 17 U/L (ref 0–35)
Albumin: 3.4 g/dL — ABNORMAL LOW (ref 3.5–5.2)
Alkaline Phosphatase: 67 U/L (ref 39–117)
Calcium: 10.1 mg/dL (ref 8.4–10.5)
GFR calc Af Amer: 50 mL/min — ABNORMAL LOW (ref >90)
Potassium: 4 mEq/L (ref 3.5–5.1)
Sodium: 141 mEq/L (ref 135–145)
Total Protein: 7.9 g/dL (ref 6.0–8.3)

## 2012-06-01 MED ORDER — MORPHINE SULFATE 4 MG/ML IJ SOLN: 4.0000 mg | Freq: Once | INTRAMUSCULAR | Status: DC

## 2012-06-01 MED ORDER — OXYCODONE-ACETAMINOPHEN 5-325 MG PO TABS
1.0000 | ORAL_TABLET | Freq: Once | ORAL | Status: AC
  Administered 2012-06-01: 1 via ORAL
  Filled 2012-06-01: qty 1

## 2012-06-01 MED ORDER — NAPROXEN 375 MG PO TABS: 375.0000 mg | ORAL_TABLET | Freq: Two times a day (BID) | ORAL | Status: DC

## 2012-06-01 MED ORDER — TRAMADOL HCL 50 MG PO TABS: 50.0000 mg | ORAL_TABLET | Freq: Four times a day (QID) | ORAL | Status: DC | PRN

## 2012-06-01 NOTE — ED Notes (Signed)
Patient transported to MRI 

## 2012-06-01 NOTE — ED Notes (Signed)
Pt comes from [facility] where she was being evaluated for right shoulder pain from a fall. Pt lives at home alone, daughter states patient has been falling frequently over past week. No acute neuro deficits, UCC requesting evaluation for CVA.  Pt is alert and oriented. Speaking clearly.

## 2012-06-01 NOTE — ED Notes (Signed)
Pt reports falling frequently in the past weeks  - injuring right arm and knee. Daughter reports that pt lives at home alone. No loc

## 2012-06-01 NOTE — ED Notes (Signed)
Pt transported to CT and XRAY 

## 2012-06-01 NOTE — ED Provider Notes (Signed)
History     CSN: [id number]  Arrival date & time 06/01/12  1256   First MD Initiated Contact with Patient 06/01/12 1311      Chief Complaint  Patient presents with  . Arm Pain    (Consider location/radiation/quality/duration/timing/severity/associated sxs/prior treatment) Patient is a 79 y.o. female presenting with arm pain. The history is provided by the patient and a relative.  Arm Pain This is a new problem. The current episode started more than 1 week ago (has been falling recently with pain to right arm). The problem has been gradually worsening. Pertinent negatives include no headaches.    Past Medical History  Diagnosis Date  . Essential hypertension, malignant   . Osteoarthrosis, unspecified whether generalized or localized, unspecified site   . Congestive heart failure, unspecified   . Unspecified vitamin D deficiency   . Unspecified hypothyroidism   . Urinary frequency     History reviewed. No pertinent past surgical history.  Family History  Problem Relation Age of Onset  . Family history unknown: Yes    History  Substance Use Topics  . Smoking status: Former Games developer  . Smokeless tobacco: Not on file  . Alcohol Use: No    OB History   Grav Para Term Preterm Abortions TAB SAB Ect Mult Living                  Review of Systems  Constitutional: Positive for activity change.  Neurological: Positive for speech difficulty and weakness. Negative for headaches.    Allergies  Codeine  Home Medications   Current Outpatient Rx  Name  Route  Sig  Dispense  Refill  . ALPRAZolam (XANAX) 0.5 MG tablet   Oral   Take 0.5 mg by mouth 2 (two) times daily as needed.           . carvedilol (COREG) 3.125 MG tablet   Oral   Take 3.125 mg by mouth 2 (two) times daily.           Marland Kitchen levothyroxine (SYNTHROID, LEVOTHROID) 88 MCG tablet   Oral   Take 88 mcg by mouth daily.           Marland Kitchen lisinopril (PRINIVIL,ZESTRIL) 10 MG tablet   Oral   Take 10 mg by mouth  2 (two) times daily.           . QUEtiapine (SEROQUEL) 25 MG tablet   Oral   Take 25 mg by mouth at bedtime.         . simvastatin (ZOCOR) 20 MG tablet   Oral   Take 20 mg by mouth at bedtime.           Marland Kitchen spironolactone (ALDACTONE) 25 MG tablet   Oral   Take 25 mg by mouth daily.             BP 129/58  Pulse 61  Temp(Src) 98 F (36.7 C) (Oral)  Resp 16  SpO2 100%  Physical Exam  Nursing note and vitals reviewed. Constitutional: She is oriented to person, place, and time. She appears well-developed and well-nourished.  HENT:  Head: Normocephalic and atraumatic.  Neck: Normal range of motion. Neck supple.  Cardiovascular: Normal rate, regular rhythm, normal heart sounds and intact distal pulses.   Pulmonary/Chest: Effort normal and breath sounds normal.  Musculoskeletal: She exhibits tenderness.       Right shoulder: She exhibits tenderness, bony tenderness and decreased strength. She exhibits normal pulse.       Right elbow: She  exhibits decreased range of motion. She exhibits no deformity. Tenderness found.       Right wrist: She exhibits decreased range of motion and tenderness. She exhibits no swelling and no deformity.  Neurological: She is alert and oriented to person, place, and time. She exhibits abnormal muscle tone.  Skin: Skin is warm and dry.    ED Course  Procedures (including critical care time)  Labs Reviewed - No data to display Dg Forearm Right  06/01/2012  *RADIOLOGY REPORT*  Clinical Data: Fall, arm pain.  RIGHT FOREARM - 2 VIEW  Comparison: None.  Findings: No acute bony abnormality.  Specifically, no fracture, subluxation, or dislocation.  Soft tissues are intact. Chondrocalcinosis noted within the right wrist.  No radiopaque foreign bodies.  IMPRESSION: No acute bony abnormality.   Original Report Authenticated By: Charlett Nose, M.D.    Dg Humerus Right  06/01/2012  *RADIOLOGY REPORT*  Clinical Data: Post fall, now with pain within the right  humerus  RIGHT HUMERUS - 2+ VIEW  Comparison: None.  Findings:  No definite fracture.  Limited visualization of the adjacent shoulder suggests mild glenohumeral and acromioclavicular degenerative change with joint space loss, subchondral sclerosis and osteophytosis.  There is minimal enthesopathic change of the greater tuberosity.  Suspected minimal amount of calcific tendonitis.  Limited visualization of the elbow is normal.  Several dermal calcifications are seen overlying the mid aspect of the humerus.  No radiopaque foreign body.  IMPRESSION: 1.  No fracture. 2.  Suspected degenerative changes of the acromioclavicular and glenohumeral joints, incompletely evaluated.   Original Report Authenticated By: Tacey Ruiz, MD      1. CVA (cerebrovascular accident)       MDM  X-rays reviewed and report per radiologist.  Here for pain in right arm from falling, thought it might be broken, x-rays neg, now feeling is prob CVA as cause of falls.needs eval, lives alone.         Linna Hoff, MD 06/01/12 [phone]

## 2012-06-01 NOTE — ED Provider Notes (Signed)
History     CSN: [id number]  Arrival date & time 06/01/12  1701   First MD Initiated Contact with Patient 06/01/12 1703      Chief Complaint  Patient presents with  . stroke like symptoms     (Consider location/radiation/quality/duration/timing/severity/associated sxs/prior treatment) The history is provided by the patient.  SARENITY RAMAKER is a 79 y.o. female history of hypertension, CHF here presenting with right arm pain. She has history of arthritis and her right arm and been hurting for the last month and worsening in the last week. She said she fell on her right arm and got worse. No aphasia or weakness or numbness. Sent in from urgent care for r/o stroke.    Past Medical History  Diagnosis Date  . Essential hypertension, malignant   . Osteoarthrosis, unspecified whether generalized or localized, unspecified site   . Congestive heart failure, unspecified   . Unspecified vitamin D deficiency   . Unspecified hypothyroidism   . Urinary frequency     Past Surgical History  Procedure Laterality Date  . Breast lumpectomy    . Breast lumpectomy      No family history on file.  History  Substance Use Topics  . Smoking status: Former Games developer  . Smokeless tobacco: Not on file  . Alcohol Use: No    OB History   Grav Para Term Preterm Abortions TAB SAB Ect Mult Living                  Review of Systems  Musculoskeletal:       R arm pain   All other systems reviewed and are negative.    Allergies  Codeine  Home Medications   Current Outpatient Rx  Name  Route  Sig  Dispense  Refill  . ALPRAZolam (XANAX) 0.5 MG tablet   Oral   Take 0.5 mg by mouth 2 (two) times daily as needed.           . carvedilol (COREG) 3.125 MG tablet   Oral   Take 3.125 mg by mouth 2 (two) times daily.           Marland Kitchen levothyroxine (SYNTHROID, LEVOTHROID) 88 MCG tablet   Oral   Take 88 mcg by mouth daily.           Marland Kitchen lisinopril (PRINIVIL,ZESTRIL) 10 MG tablet   Oral    Take 10 mg by mouth 2 (two) times daily.           . QUEtiapine (SEROQUEL) 25 MG tablet   Oral   Take 25 mg by mouth at bedtime.         . simvastatin (ZOCOR) 20 MG tablet   Oral   Take 20 mg by mouth at bedtime.           Marland Kitchen spironolactone (ALDACTONE) 25 MG tablet   Oral   Take 25 mg by mouth daily.             BP 109/54  Pulse 95  Temp(Src) 98.3 F (36.8 C) (Oral)  Resp 20  Ht 5\' 1"  (1.549 m)  Wt 178 lb (80.74 kg)  BMI 33.65 kg/m2  SpO2 100%  Physical Exam  Nursing note and vitals reviewed. Constitutional: She is oriented to person, place, and time. She appears well-developed and well-nourished.  Uncomfortable   HENT:  Head: Normocephalic.  Mouth/Throat: Oropharynx is clear and moist.  Eyes: Conjunctivae are normal. Pupils are equal, round, and reactive to light.  Neck: Normal  range of motion. Neck supple.  Cardiovascular: Normal rate, regular rhythm and normal heart sounds.   Pulmonary/Chest: Effort normal and breath sounds normal. No respiratory distress. She has no wheezes. She has no rales.  Abdominal: Bowel sounds are normal. She exhibits no distension. There is no tenderness. There is no rebound.  Musculoskeletal:  Dec ROM R shoulder. Dec hand grasp due to pain.   Neurological: She is alert and oriented to person, place, and time.  Dec strength R arm due to pain. R leg strength nl. Sensation intact. No facial droop, nl speech   Skin: Skin is warm and dry.  Psychiatric: She has a normal mood and affect. Her behavior is normal. Judgment and thought content normal.    ED Course  Procedures (including critical care time)  Labs Reviewed  COMPREHENSIVE METABOLIC PANEL - Abnormal; Notable for the following:    Glucose, Bld 111 (*)    Albumin 3.4 (*)    GFR calc non Af Amer 44 (*)    GFR calc Af Amer 50 (*)    All other components within normal limits  CBC WITH DIFFERENTIAL   Dg Shoulder Right  06/01/2012  *RADIOLOGY REPORT*  Clinical Data: Pain post  fall.  RIGHT SHOULDER - 2+ VIEW  Comparison: None.  Findings: Partially calcific pannus at the cephalad aspect of the [facility] joint.  Negative for fracture, dislocation, or other acute bony abnormality.  Spondylitic changes in the visualized lower thoracic spine.  IMPRESSION: 1.  Negative for fracture or other acute abnormality. 2.  AC joint and lower thoracic degenerative changes as above.   Original Report Authenticated By: D. Andria Rhein, MD    Dg Forearm Right  06/01/2012  *RADIOLOGY REPORT*  Clinical Data: Fall, arm pain.  RIGHT FOREARM - 2 VIEW  Comparison: None.  Findings: No acute bony abnormality.  Specifically, no fracture, subluxation, or dislocation.  Soft tissues are intact. Chondrocalcinosis noted within the right wrist.  No radiopaque foreign bodies.  IMPRESSION: No acute bony abnormality.   Original Report Authenticated By: Charlett Nose, M.D.    Ct Head Wo Contrast  06/01/2012  *RADIOLOGY REPORT*  Clinical Data: Right-sided weakness.  CT HEAD WITHOUT CONTRAST  Technique:  Contiguous axial images were obtained from the base of the skull through the vertex without contrast.  Comparison: 01/23/2012.  Findings: Motion degraded exam.  No intracranial hemorrhage or CT evidence of large acute infarct.  Small vessel disease type changes.  Global atrophy without hydrocephalus.  No intracranial mass lesion detected on this unenhanced exam.  Mastoid air cells, middle ear cavities and visualized sinuses are clear.  IMPRESSION: Motion degraded exam.  No intracranial hemorrhage or CT evidence of large acute infarct.   Original Report Authenticated By: Lacy Duverney, M.D.    Mr Brain Wo Contrast  06/01/2012  *RADIOLOGY REPORT*  Clinical Data: Right-sided weakness.  Rule out stroke.  MRI HEAD WITHOUT CONTRAST  Technique:  Multiplanar, multiecho pulse sequences of the brain and surrounding structures were obtained according to standard protocol without intravenous contrast.  Comparison: 06/01/2012  Findings:  Negative for acute infarct.  Mild chronic microvascular ischemic changes in the white matter.  Mild atrophy.  Negative for hemorrhage or mass lesion.  Mucosal edema in the paranasal sinuses and right mastoid sinus.  No air-fluid levels.  IMPRESSION: Chronic microvascular ischemia.  No acute infarct.   Original Report Authenticated By: Janeece Riggers, M.D.    Dg Humerus Right  06/01/2012  *RADIOLOGY REPORT*  Clinical Data: Post fall, now with  pain within the right humerus  RIGHT HUMERUS - 2+ VIEW  Comparison: None.  Findings:  No definite fracture.  Limited visualization of the adjacent shoulder suggests mild glenohumeral and acromioclavicular degenerative change with joint space loss, subchondral sclerosis and osteophytosis.  There is minimal enthesopathic change of the greater tuberosity.  Suspected minimal amount of calcific tendonitis.  Limited visualization of the elbow is normal.  Several dermal calcifications are seen overlying the mid aspect of the humerus.  No radiopaque foreign body.  IMPRESSION: 1.  No fracture. 2.  Suspected degenerative changes of the acromioclavicular and glenohumeral joints, incompletely evaluated.   Original Report Authenticated By: Tacey Ruiz, MD      No diagnosis found.   Date: 06/01/2012  Rate: 57  Rhythm: normal sinus rhythm  QRS Axis: normal  Intervals: normal  ST/T Wave abnormalities: nonspecific ST changes  Conduction Disutrbances:left bundle branch block  Narrative Interpretation:   Old EKG Reviewed: unchanged    MDM  TAIYLOR VIRDEN is a 79 y.o. female here with R arm pain. Likely from arthritis. But will need to r/o stroke. Will get labs and CT head. EKG unchanged.   9:01 PM CT head nl. MRI brain nl. Low suspicion for stroke. I think her symptoms are more consistent with arthritis pain. Will d/c home on naprosyn and ultram. She will f/u with pmd.        Richardean Canal, MD 06/01/12 2102

## 2012-06-01 NOTE — ED Notes (Signed)
[facility] called for transport/charge nurse called for report

## 2012-06-17 ENCOUNTER — Encounter: Payer: Self-pay | Admitting: Hematology

## 2013-03-14 ENCOUNTER — Inpatient Hospital Stay (HOSPITAL_COMMUNITY)
Admission: EM | Admit: 2013-03-14 | Discharge: 2013-03-16 | DRG: 558 | Disposition: A | Discharge status: Home Health | Payer: Medicare Other | Attending: Internal Medicine | Admitting: Internal Medicine

## 2013-03-14 ENCOUNTER — Encounter (HOSPITAL_COMMUNITY): Payer: Self-pay | Admitting: Emergency Medicine

## 2013-03-14 ENCOUNTER — Emergency Department (INDEPENDENT_AMBULATORY_CARE_PROVIDER_SITE_OTHER)
Admission: EM | Admit: 2013-03-14 | Discharge: 2013-03-14 | Disposition: A | Discharge status: Nursing Home (Includes ICF) | Payer: Medicare Other | Source: Home / Self Care | Attending: Family Medicine | Admitting: Family Medicine

## 2013-03-14 DIAGNOSIS — R609 Edema, unspecified: Secondary | ICD-10-CM

## 2013-03-14 DIAGNOSIS — R600 Localized edema: Secondary | ICD-10-CM

## 2013-03-14 DIAGNOSIS — M25559 Pain in unspecified hip: Secondary | ICD-10-CM

## 2013-03-14 DIAGNOSIS — I10 Essential (primary) hypertension: Secondary | ICD-10-CM | POA: Diagnosis present

## 2013-03-14 DIAGNOSIS — R748 Abnormal levels of other serum enzymes: Secondary | ICD-10-CM

## 2013-03-14 DIAGNOSIS — Z66 Do not resuscitate: Secondary | ICD-10-CM | POA: Diagnosis present

## 2013-03-14 DIAGNOSIS — M6282 Rhabdomyolysis: Principal | ICD-10-CM | POA: Diagnosis present

## 2013-03-14 DIAGNOSIS — Z853 Personal history of malignant neoplasm of breast: Secondary | ICD-10-CM

## 2013-03-14 DIAGNOSIS — W19XXXA Unspecified fall, initial encounter: Secondary | ICD-10-CM

## 2013-03-14 DIAGNOSIS — F411 Generalized anxiety disorder: Secondary | ICD-10-CM | POA: Diagnosis present

## 2013-03-14 DIAGNOSIS — E039 Hypothyroidism, unspecified: Secondary | ICD-10-CM | POA: Diagnosis present

## 2013-03-14 DIAGNOSIS — I509 Heart failure, unspecified: Secondary | ICD-10-CM | POA: Diagnosis present

## 2013-03-14 DIAGNOSIS — Z8679 Personal history of other diseases of the circulatory system: Secondary | ICD-10-CM

## 2013-03-14 DIAGNOSIS — R531 Weakness: Secondary | ICD-10-CM

## 2013-03-14 DIAGNOSIS — M1990 Unspecified osteoarthritis, unspecified site: Secondary | ICD-10-CM | POA: Diagnosis present

## 2013-03-14 DIAGNOSIS — E559 Vitamin D deficiency, unspecified: Secondary | ICD-10-CM | POA: Diagnosis present

## 2013-03-14 DIAGNOSIS — Y92009 Unspecified place in unspecified non-institutional (private) residence as the place of occurrence of the external cause: Secondary | ICD-10-CM

## 2013-03-14 DIAGNOSIS — N6320 Unspecified lump in the left breast, unspecified quadrant: Secondary | ICD-10-CM | POA: Diagnosis present

## 2013-03-14 DIAGNOSIS — N63 Unspecified lump in unspecified breast: Secondary | ICD-10-CM | POA: Diagnosis present

## 2013-03-14 DIAGNOSIS — W06XXXA Fall from bed, initial encounter: Secondary | ICD-10-CM | POA: Diagnosis present

## 2013-03-14 DIAGNOSIS — Z87891 Personal history of nicotine dependence: Secondary | ICD-10-CM

## 2013-03-14 DIAGNOSIS — M25551 Pain in right hip: Secondary | ICD-10-CM

## 2013-03-14 LAB — CBC WITH DIFFERENTIAL/PLATELET
Basophils Absolute: 0 10*3/uL (ref 0.0–0.1)
Basophils Relative: 0 % (ref 0–1)
Eosinophils Relative: 1 % (ref 0–5)
HCT: 40.8 % (ref 36.0–46.0)
Hemoglobin: 13.7 g/dL (ref 12.0–15.0)
Lymphocytes Relative: 17 % (ref 12–46)
MCHC: 33.6 g/dL (ref 30.0–36.0)
MCV: 94.7 fL (ref 78.0–100.0)
Monocytes Absolute: 0.6 10*3/uL (ref 0.1–1.0)
Monocytes Relative: 8 % (ref 3–12)
Neutro Abs: 6.2 10*3/uL (ref 1.7–7.7)
RDW: 13.1 % (ref 11.5–15.5)

## 2013-03-14 LAB — COMPREHENSIVE METABOLIC PANEL
BUN: 19 mg/dL (ref 6–23)
CO2: 25 mEq/L (ref 19–32)
Calcium: 9.6 mg/dL (ref 8.4–10.5)
Chloride: 100 mEq/L (ref 96–112)
Creatinine, Ser: 0.88 mg/dL (ref 0.50–1.10)
GFR calc non Af Amer: 54 mL/min — ABNORMAL LOW (ref >90)
Total Bilirubin: 1.4 mg/dL — ABNORMAL HIGH (ref 0.3–1.2)

## 2013-03-14 NOTE — ED Notes (Signed)
Family reported that pt. fell last night at home between her bed and  book shelf , no LOC , pt. lives alone and uses a cane to ambulate , family is also concerned about progressing lower legs swelling for 2 months worse past several days .

## 2013-03-14 NOTE — ED Provider Notes (Signed)
[name] is a 79 y.o. female who presents to Urgent Care today for fall, leg swelling, right leg pain. Patient has been falling frequently recently. Yesterday afternoon she fell and was stuck in the floor for several hours. Yesterday evening she fell against the bed in the bookshelf and was trapped between the bed the book shelf all evening for approximately 8 hours. Right leg was entrapped by the bookshelf. She was found this morning by her family. She notes continued right tibia pain where she was entrapped. She additionally notes much worsening lower extremity swelling. She typically has on and off pedal edema but however for the last day her lower extremity edema has progressed to her hips. Her children are concerned about her. She denies any chest pains or trouble breathing. Patient has difficulty walking since the fall.   Past Medical History  Diagnosis Date  . Essential hypertension, malignant   . Osteoarthrosis, unspecified whether generalized or localized, unspecified site   . Congestive heart failure, unspecified   . Unspecified vitamin D deficiency   . Unspecified hypothyroidism   . Urinary frequency   . Anxiety    History  Substance Use Topics  . Smoking status: Former Games developer  . Smokeless tobacco: Not on file  . Alcohol Use: No   ROS as above Medications reviewed. No current facility-administered medications for this encounter.   Current Outpatient Prescriptions  Medication Sig Dispense Refill  . ALPRAZolam (XANAX) 0.5 MG tablet Take 0.5 mg by mouth 2 (two) times daily as needed.        . carvedilol (COREG) 3.125 MG tablet Take 6.25 mg by mouth daily.       . cholecalciferol (VITAMIN D) 1000 UNITS tablet Take 1,000 Units by mouth daily.      Marland Kitchen gabapentin (NEURONTIN) 100 MG capsule Take 100 mg by mouth at bedtime.      Marland Kitchen levothyroxine (SYNTHROID, LEVOTHROID) 88 MCG tablet Take 88 mcg by mouth daily.        Marland Kitchen lisinopril (PRINIVIL,ZESTRIL) 10 MG tablet Take 10 mg by  mouth 2 (two) times daily.        . naproxen (NAPROSYN) 375 MG tablet Take 1 tablet (375 mg total) by mouth 2 (two) times daily.  20 tablet  0  . QUEtiapine (SEROQUEL) 25 MG tablet Take 25 mg by mouth at bedtime.      . simvastatin (ZOCOR) 20 MG tablet Take 40 mg by mouth at bedtime.       Marland Kitchen spironolactone (ALDACTONE) 25 MG tablet Take 25 mg by mouth daily.        . traMADol (ULTRAM) 50 MG tablet Take 1 tablet (50 mg total) by mouth every 6 (six) hours as needed for pain.  15 tablet  0  . zolpidem (AMBIEN) 5 MG tablet Take 5 mg by mouth at bedtime as needed for sleep.        Exam:  BP 140/87  Pulse 68  Temp(Src) 97.9 F (36.6 C) (Oral)  Resp 20  SpO2 100% Gen: Elderly and frail-appearing woman HEENT: EOMI,  MMM Lungs: Normal work of breathing. CTABL Heart: No murmurs noted Abd: NABS, Soft. NT, ND Exts: 3+ lower extremity pitting edema to hips bilaterally. There is a firm indentation in the right anterior lower leg. This is tender to touch.    No results found for this or any previous visit (from the past 24 hour(s)). No results found.  Assessment and Plan: 79 y.o. female with multiple severe medical problems with  recent increasing falls, leg pain and recent entrapment.  I am concerned specifically about rhabdomyolysis due to muscle injury. Additionally she may have worsening heart failure. Regardless her recent increasing falls is highly concerning. Patient lives alone and is not currently safe to return home. Plan to transfer to the emergency room for further evaluation and management.  Discussed warning signs or symptoms. Please see discharge instructions. Patient expresses understanding.      Rodolph Bong, MD 03/14/13 2000

## 2013-03-14 NOTE — ED Notes (Signed)
Pt c/o bilateral leg swelling... Onset unknown but daughter states that this am pt was unable to move due to swelling and pain Dr. Denyse Amass is in w/the pt She is alert w/no signs of acute distress.

## 2013-03-15 ENCOUNTER — Encounter (HOSPITAL_COMMUNITY): Payer: Self-pay | Admitting: Internal Medicine

## 2013-03-15 ENCOUNTER — Emergency Department (HOSPITAL_COMMUNITY): Payer: Medicare Other

## 2013-03-15 ENCOUNTER — Inpatient Hospital Stay (HOSPITAL_COMMUNITY): Payer: Medicare Other

## 2013-03-15 DIAGNOSIS — R5381 Other malaise: Secondary | ICD-10-CM

## 2013-03-15 DIAGNOSIS — E039 Hypothyroidism, unspecified: Secondary | ICD-10-CM | POA: Diagnosis present

## 2013-03-15 DIAGNOSIS — Z66 Do not resuscitate: Secondary | ICD-10-CM | POA: Diagnosis present

## 2013-03-15 DIAGNOSIS — N63 Unspecified lump in unspecified breast: Secondary | ICD-10-CM

## 2013-03-15 DIAGNOSIS — W19XXXA Unspecified fall, initial encounter: Secondary | ICD-10-CM

## 2013-03-15 DIAGNOSIS — M6282 Rhabdomyolysis: Principal | ICD-10-CM | POA: Diagnosis present

## 2013-03-15 DIAGNOSIS — N6320 Unspecified lump in the left breast, unspecified quadrant: Secondary | ICD-10-CM | POA: Diagnosis present

## 2013-03-15 DIAGNOSIS — Y92009 Unspecified place in unspecified non-institutional (private) residence as the place of occurrence of the external cause: Secondary | ICD-10-CM

## 2013-03-15 DIAGNOSIS — M25559 Pain in unspecified hip: Secondary | ICD-10-CM

## 2013-03-15 DIAGNOSIS — Z8679 Personal history of other diseases of the circulatory system: Secondary | ICD-10-CM

## 2013-03-15 DIAGNOSIS — I10 Essential (primary) hypertension: Secondary | ICD-10-CM | POA: Diagnosis present

## 2013-03-15 DIAGNOSIS — R748 Abnormal levels of other serum enzymes: Secondary | ICD-10-CM

## 2013-03-15 LAB — URINALYSIS, ROUTINE W REFLEX MICROSCOPIC
Ketones, ur: 40 mg/dL — AB
Leukocytes, UA: NEGATIVE
Nitrite: NEGATIVE
Protein, ur: 100 mg/dL — AB
Urobilinogen, UA: 1 mg/dL (ref 0.0–1.0)

## 2013-03-15 LAB — CK: Total CK: 1782 U/L — ABNORMAL HIGH (ref 7–177)

## 2013-03-15 LAB — URINE MICROSCOPIC-ADD ON

## 2013-03-15 LAB — PRO B NATRIURETIC PEPTIDE: Pro B Natriuretic peptide (BNP): 864.2 pg/mL — ABNORMAL HIGH (ref 0–450)

## 2013-03-15 LAB — TSH: TSH: 3.111 u[IU]/mL (ref 0.350–4.500)

## 2013-03-15 MED ORDER — VITAMIN D3 25 MCG (1000 UNIT) PO TABS
1000.0000 [IU] | ORAL_TABLET | Freq: Every day | ORAL | Status: DC
  Administered 2013-03-15 – 2013-03-16 (×2): 1000 [IU] via ORAL
  Filled 2013-03-15 (×2): qty 1

## 2013-03-15 MED ORDER — CARVEDILOL 6.25 MG PO TABS
6.2500 mg | ORAL_TABLET | Freq: Every day | ORAL | Status: DC
  Administered 2013-03-15 – 2013-03-16 (×2): 6.25 mg via ORAL
  Filled 2013-03-15 (×3): qty 1

## 2013-03-15 MED ORDER — LEVOTHYROXINE SODIUM 88 MCG PO TABS
88.0000 ug | ORAL_TABLET | Freq: Every day | ORAL | Status: DC
  Administered 2013-03-15 – 2013-03-16 (×2): 88 ug via ORAL
  Filled 2013-03-15 (×3): qty 1

## 2013-03-15 MED ORDER — MORPHINE SULFATE 2 MG/ML IJ SOLN: 0.5000 mg | INTRAMUSCULAR | Status: DC | PRN

## 2013-03-15 MED ORDER — SIMVASTATIN 40 MG PO TABS
40.0000 mg | ORAL_TABLET | Freq: Every day | ORAL | Status: DC
  Filled 2013-03-15: qty 1

## 2013-03-15 MED ORDER — ONDANSETRON HCL 4 MG/2ML IJ SOLN: 4.0000 mg | Freq: Four times a day (QID) | INTRAMUSCULAR | Status: DC | PRN

## 2013-03-15 MED ORDER — HEPARIN SODIUM (PORCINE) 5000 UNIT/ML IJ SOLN
5000.0000 [IU] | Freq: Three times a day (TID) | INTRAMUSCULAR | Status: DC
  Administered 2013-03-15 – 2013-03-16 (×4): 5000 [IU] via SUBCUTANEOUS
  Filled 2013-03-15 (×7): qty 1

## 2013-03-15 MED ORDER — LISINOPRIL 10 MG PO TABS
10.0000 mg | ORAL_TABLET | Freq: Two times a day (BID) | ORAL | Status: DC
  Administered 2013-03-15 – 2013-03-16 (×3): 10 mg via ORAL
  Filled 2013-03-15 (×4): qty 1

## 2013-03-15 MED ORDER — ONDANSETRON HCL 4 MG PO TABS: 4.0000 mg | ORAL_TABLET | Freq: Four times a day (QID) | ORAL | Status: DC | PRN

## 2013-03-15 MED ORDER — QUETIAPINE FUMARATE 25 MG PO TABS
25.0000 mg | ORAL_TABLET | Freq: Every day | ORAL | Status: DC
  Administered 2013-03-15: 22:00:00 25 mg via ORAL
  Filled 2013-03-15 (×2): qty 1

## 2013-03-15 MED ORDER — DOCUSATE SODIUM 100 MG PO CAPS
100.0000 mg | ORAL_CAPSULE | Freq: Two times a day (BID) | ORAL | Status: DC
  Administered 2013-03-15 – 2013-03-16 (×3): 100 mg via ORAL
  Filled 2013-03-15 (×4): qty 1

## 2013-03-15 MED ORDER — ALPRAZOLAM 0.5 MG PO TABS
0.5000 mg | ORAL_TABLET | Freq: Two times a day (BID) | ORAL | Status: DC | PRN
  Administered 2013-03-15: 23:00:00 0.5 mg via ORAL
  Filled 2013-03-15: qty 1

## 2013-03-15 MED ORDER — GABAPENTIN 100 MG PO CAPS
100.0000 mg | ORAL_CAPSULE | Freq: Every day | ORAL | Status: DC
  Administered 2013-03-15: 22:00:00 100 mg via ORAL
  Filled 2013-03-15 (×2): qty 1

## 2013-03-15 MED ORDER — DEXTROSE-NACL 5-0.9 % IV SOLN
INTRAVENOUS | Status: DC
  Administered 2013-03-15 – 2013-03-16 (×2): via INTRAVENOUS

## 2013-03-15 MED ORDER — TRAMADOL HCL 50 MG PO TABS
50.0000 mg | ORAL_TABLET | Freq: Four times a day (QID) | ORAL | Status: DC | PRN
  Administered 2013-03-16: 50 mg via ORAL
  Filled 2013-03-15: qty 1

## 2013-03-15 NOTE — Progress Notes (Signed)
Utilization Review Completed.[name] T11/25/2014

## 2013-03-15 NOTE — ED Notes (Addendum)
Pt.'s left breast under her nipple has a hardened area with some redness. Pt. Has hX. Of previous lumpectomy on that same breast. She denies pain. Family states it has been a while since she had her last mammogram. Pt. Also has areas on both her arms, abdomen, back and chest that is a rash that is healing. Family states she has a habit of scratching these areas. She has new RX for atarax and was placed back on her ativan. Family states it seems to have helped.

## 2013-03-15 NOTE — Progress Notes (Addendum)
Social work Tax inspector met with pt at bedside and discussed discharge plans. Social work Tax inspector reviewed why going to snf is so important and pt replied "I don't want to go." Social work Health visitor of refusal. Social work Administrator, arts off. If needed, still available .  Baruch Goldmann, [place] Intern [phone] I have read and agree with above note.  Lorri Frederick. [place]  [phone]

## 2013-03-15 NOTE — H&P (Signed)
[facility] History and Physical  [name] ZOX:[id number] DOB: 1916/03/11    PCP:   Willey Blade, MD   Chief Complaint: found on the floor after a fall.  HPI: [name] is an 79 y.o. female lives alone with family close by, hx of CHF, HTN, hx of breast cancer on the left, hypothyroidism on supplement, brought to the ER after she fell and unable to get up all night.  Her daughter came to the house and found her.  She denied loss of consciousness, just unable to get up.  She has pain in her right hip and unable to walk.  Evaluation in the ER included negative right hip xray, negative right knee xray, and negative chest xray. She was found to have rhabdo with CPK of 1700, and normal renal fx tests.  She has normal CBC.  Family also was concerned about her left breast firmness.  Hospitalist was asked to admit her because she can't walk and having rhabdo.  She denied fever, chills, HA, nausea, vomiting, chest pain, palpitation or shortness of breath.  Rewiew of Systems:  Constitutional: Negative for malaise, fever and chills. No significant weight loss or weight gain Eyes: Negative for eye pain, redness and discharge, diplopia, visual changes, or flashes of light. ENMT: Negative for ear pain, hoarseness, nasal congestion, sinus pressure and sore throat. No headaches; tinnitus, drooling, or problem swallowing. Cardiovascular: Negative for chest pain, palpitations, diaphoresis, dyspnea and peripheral edema. ; No orthopnea, PND Respiratory: Negative for cough, hemoptysis, wheezing and stridor. No pleuritic chestpain. Gastrointestinal: Negative for nausea, vomiting, diarrhea, constipation, abdominal pain, melena, blood in stool, hematemesis, jaundice and rectal bleeding.    Genitourinary: Negative for frequency, dysuria, incontinence,flank pain and hematuria; Musculoskeletal: Negative for back pain and neck pain. Negative for swelling and trauma.;  Skin: . Negative for pruritus, rash,  abrasions, bruising and skin lesion.; ulcerations Neuro: Negative for headache, lightheadedness and neck stiffness. altered level of consciousness , altered mental status, extremity weakness, burning feet, involuntary movement, seizure and syncope.  Psych: negative for anxiety, depression, insomnia, tearfulness, panic attacks, hallucinations, paranoia, suicidal or homicidal ideation  Breast firmness on the left lower area.   Past Medical History  Diagnosis Date  . Essential hypertension, malignant   . Osteoarthrosis, unspecified whether generalized or localized, unspecified site   . Congestive heart failure, unspecified   . Unspecified vitamin D deficiency   . Unspecified hypothyroidism   . Urinary frequency   . Anxiety     Past Surgical History  Procedure Laterality Date  . Breast lumpectomy    . Breast lumpectomy      Medications:  HOME MEDS: Prior to Admission medications   Medication Sig Start Date End Date Taking? Authorizing Provider  ALPRAZolam Prudy Feeler) 0.5 MG tablet Take 0.5 mg by mouth 2 (two) times daily as needed for anxiety.    Yes Historical Provider, MD  carvedilol (COREG) 3.125 MG tablet Take 6.25 mg by mouth daily.    Yes Historical Provider, MD  cholecalciferol (VITAMIN D) 1000 UNITS tablet Take 1,000 Units by mouth daily.   Yes Historical Provider, MD  gabapentin (NEURONTIN) 100 MG capsule Take 100 mg by mouth at bedtime.   Yes Historical Provider, MD  hydrOXYzine (ATARAX/VISTARIL) 10 MG tablet Take 10 mg by mouth 3 (three) times daily as needed for itching.   Yes Historical Provider, MD  levothyroxine (SYNTHROID, LEVOTHROID) 88 MCG tablet Take 88 mcg by mouth daily.     Yes Historical Provider, MD  lisinopril (  PRINIVIL,ZESTRIL) 10 MG tablet Take 10 mg by mouth 2 (two) times daily.     Yes Historical Provider, MD  naproxen (NAPROSYN) 375 MG tablet Take 1 tablet (375 mg total) by mouth 2 (two) times daily. 06/01/12  Yes Richardean Canal, MD  QUEtiapine (SEROQUEL) 25 MG  tablet Take 25 mg by mouth at bedtime.   Yes Historical Provider, MD  simvastatin (ZOCOR) 20 MG tablet Take 40 mg by mouth at bedtime.    Yes Historical Provider, MD  spironolactone (ALDACTONE) 25 MG tablet Take 25 mg by mouth daily.     Yes Historical Provider, MD  traMADol (ULTRAM) 50 MG tablet Take 1 tablet (50 mg total) by mouth every 6 (six) hours as needed for pain. 06/01/12  Yes Richardean Canal, MD  zolpidem (AMBIEN) 5 MG tablet Take 5 mg by mouth at bedtime as needed for sleep.   Yes Historical Provider, MD     Allergies:  Allergies  Allergen Reactions  . Codeine Nausea And Vomiting    Social History:   reports that she has quit smoking. She does not have any smokeless tobacco history on file. She reports that she does not drink alcohol or use illicit drugs.  Family History: No family history on file.   Physical Exam: Filed Vitals:   03/15/13 0010 03/15/13 0015 03/15/13 0030 03/15/13 0255  BP: 141/57 140/72 137/67 141/59  Pulse:    65  Temp:    97 F (36.1 C)  TempSrc:    Axillary  Resp:    18  SpO2:    94%   Blood pressure 141/59, pulse 65, temperature 97 F (36.1 C), temperature source Axillary, resp. rate 18, SpO2 94.00%.  GEN:  Pleasant  patient lying in the stretcher in no acute distress; cooperative with exam. PSYCH:  alert and oriented x4; does not appear anxious or depressed; affect is appropriate. HEENT: Mucous membranes pink and anicteric; PERRLA; EOM intact; no cervical lymphadenopathy nor thyromegaly or carotid bruit; no JVD; There were no stridor. Neck is very supple. Breasts:: She does have firmness over the inferior left breast with mild peau D'orange. CHEST WALL: No tenderness. CHEST: Normal respiration, clear to auscultation bilaterally.  HEART: Regular rate and rhythm.  There are no murmur, rub, or gallops.   BACK: No kyphosis or scoliosis; no CVA tenderness ABDOMEN: soft and non-tender; no masses, no organomegaly, normal abdominal bowel sounds; no  pannus; no intertriginous candida. There is no rebound and no distention. Rectal Exam: Not done EXTREMITIES: No bone or joint deformity; age-appropriate arthropathy of the hands and knees; no edema; no ulcerations.  There is no calf tenderness. Tenderness with moving her hips. Genitalia: not examined PULSES: 2+ and symmetric SKIN: Normal hydration no rash or ulceration CNS: Cranial nerves 2-12 grossly intact no focal lateralizing neurologic deficit.  Speech is fluent; uvula elevated with phonation, facial symmetry and tongue midline. DTR are normal bilaterally, cerebella exam is intact, barbinski is negative and strengths are equaled bilaterally.  No sensory loss.   Labs on Admission:  Basic Metabolic Panel:  Recent Labs Lab 03/14/13 2105  NA 140  K 3.6  CL 100  CO2 25  GLUCOSE 101*  BUN 19  CREATININE 0.88  CALCIUM 9.6   Liver Function Tests:  Recent Labs Lab 03/14/13 2105  AST 44*  ALT 18  ALKPHOS 76  BILITOT 1.4*  PROT 7.8  ALBUMIN 3.5   No results found for this basename: LIPASE, AMYLASE,  in the last 168 hours No  results found for this basename: AMMONIA,  in the last 168 hours CBC:  Recent Labs Lab 03/14/13 2105  WBC 8.4  NEUTROABS 6.2  HGB 13.7  HCT 40.8  MCV 94.7  PLT 290   Cardiac Enzymes:  Recent Labs Lab 03/15/13 0042  CKTOTAL 1782*    CBG: No results found for this basename: GLUCAP,  in the last 168 hours   Radiological Exams on Admission: Dg Chest 2 View  03/15/2013   CLINICAL DATA:  Fall.  Right leg pain.  EXAM: CHEST  2 VIEW  COMPARISON:  05/29/2011  FINDINGS: Shallow inspiration. Heart size and pulmonary vascularity are normal for technique. Study is technically limited due to overlying soft tissue attenuation and artifact but there is no evidence of any focal consolidation or airspace disease in the lungs. No blunting of costophrenic angles. Surgical clips in the right upper quadrant. Tortuous aorta. Esophageal hiatal hernia behind the  heart.  IMPRESSION: No active cardiopulmonary disease.   Electronically Signed   By: Burman Nieves M.D.   On: 03/15/2013 01:15   Dg Hip Complete Right  03/15/2013   CLINICAL DATA:  Fall.  Right leg pain.  EXAM: RIGHT HIP - COMPLETE 2+ VIEW  COMPARISON:  None.  FINDINGS: Probably degenerative changes in the right hip with complete loss of the acetabular joint space and sclerosis on both sides of the joint. There are subchondral cysts in the hip. Avascular necrosis is not excluded. Soft tissue calcifications over the pelvis and hips probably represent injection granulomas. No evidence of acute fracture or subluxation of the pelvis or hips.  IMPRESSION: Prominent degenerative changes in the right hip. No displaced fractures identified.   Electronically Signed   By: Burman Nieves M.D.   On: 03/15/2013 01:17   Dg Tibia/fibula Right  03/15/2013   CLINICAL DATA:  Right leg pain after a fall.  EXAM: RIGHT TIBIA AND FIBULA - 2 VIEW  COMPARISON:  Right knee 03/11/2005  FINDINGS: Prominent degenerative changes in the right knee with narrowed medial and lateral compartments and associated hypertrophic changes. Degenerative changes in the patellofemoral compartment. Joint space calcifications consistent with chondrocalcinosis. Degenerative changes have progressed since previous study. No significant effusion in the right knee. There is no evidence of acute fracture of the tibia or fibula. Multiple soft tissue calcifications suggest or matter myositis or vascular calcification. Plantar and Achilles calcaneal spurs.  IMPRESSION: Degenerative changes of the right knee with progression. No acute fractures identified.   Electronically Signed   By: Burman Nieves M.D.   On: 03/15/2013 01:18    Assessment/Plan Present on Admission:  . Rhabdomyolysis . HTN (hypertension) . Hypothyroidism . Breast mass, left . DNR no code (do not resuscitate)  PLAN:  She has rhabdo and will receive gentle IVF.  She has hx of  CHF, and being 89, we will be quite careful.  She will need a CT of her hip to exclude a hip fracture which could be missed by plain film.  Family would like to have home health aids to her house with discharge.  I will therefore consult social service to help with this.  She will get OT/PT.   For her thyroidism, will continue her 88 mcg of sythroid, and check TSH.  Please ask a surgeon to examine her breast, as family is quite concerned about this.  She is otherwise stable and will be admitted to [facility] service.  I had a chance to discuss her code status, and confirmed that she would like  to be DNR.  We will honor her wishes.  Thank you for asking me to participate in her care.  Other plans as per orders.  Code Status: DNR.   Houston Siren, MD. [facility] Pager [phone] 7pm to 7am.  03/15/2013, 3:53 AM

## 2013-03-15 NOTE — Evaluation (Signed)
Physical Therapy Evaluation Patient Details Name: [name] MRN: [id number] DOB: 1915-07-25 Today's Date: 03/15/2013 Time: 9629-5284 PT Time Calculation (min): 22 min  PT Assessment / Plan / Recommendation History of Present Illness  [name] is an 79 y.o. female lives alone with family close by, hx of CHF, HTN, hx of breast cancer on the left, hypothyroidism on supplement, brought to the ER after she fell and unable to get up all night.  Her daughter came to the house and found her.  She denied loss of consciousness, just unable to get up.  She has pain in her right hip and unable to walk.  Evaluation in the ER included negative right hip xray, negative right knee xray, and negative chest xray. She was found to have rhabdo with CPK of 1700, and normal renal fx tests.    Clinical Impression  Pt admitted with above. Pt currently with functional limitations due to the deficits listed below (see PT Problem List). Pt needs 24 hour care at d/c.  If she does not have 24 hour care, will need NHP with therapy.  Pt is refusing NH but feel that if her family cannot assist her she will need NH.  Pt will benefit from skilled PT to increase their independence and safety with mobility to allow discharge to the venue listed below.     PT Assessment  Patient needs continued PT services    Follow Up Recommendations  SNF;Supervision/Assistance - 24 hour;Home health PT (Recommend 24 hour care but if she doesnt' have will need SNF)        Barriers to Discharge Decreased caregiver support      Equipment Recommendations  None recommended by PT         Frequency Min 3X/week    Precautions / Restrictions Precautions Precautions: Fall Restrictions Weight Bearing Restrictions: No   Pertinent Vitals/Pain VSS, no pain      Mobility  Bed Mobility Bed Mobility: Supine to Sit;Sitting - Scoot to Edge of Bed Supine to Sit: 2: Max assist;HOB elevated;With rails Sitting - Scoot to Edge of Bed:  3: Mod assist Details for Bed Mobility Assistance: Assist to bring LEs to EOB and to support trunk. Use of draw pad to pivot hips toward EOB. Incr time due to stiff/sore from laying in bed. Transfers Transfers: Sit to Stand;Stand to [employer] Transfers Sit to Stand: 3: Mod assist;From bed;With upper extremity assist Stand to Sit: 3: Mod assist;To chair/3-in-1;With armrests;With upper extremity assist Stand Pivot Transfers: 3: Mod assist Details for Transfer Assistance: Assist for power up from bed and to provide support for balance once standing.Max cues to pivot from chair to 3N1.   Ambulation/Gait Ambulation/Gait Assistance: 1: +2 Total assist Ambulation/Gait: Patient Percentage: 70% Ambulation Distance (Feet): 10 Feet Assistive device: Rolling walker Ambulation/Gait Assistance Details: Needed max cues for sequencing steps and RW.  Poor safety with RW.  Flexed posture needing cues for postural stability.   Gait Pattern: Step-to pattern;Decreased stride length;Decreased step length - right;Decreased step length - left;Shuffle;Trunk flexed;Wide base of support Gait velocity: decreased Stairs: No Wheelchair Mobility Wheelchair Mobility: No         PT Diagnosis: Generalized weakness  PT Problem List: Decreased activity tolerance;Decreased balance;Decreased mobility;Decreased knowledge of use of DME;Decreased safety awareness;Decreased knowledge of precautions PT Treatment Interventions: DME instruction;Gait training;Therapeutic activities;Functional mobility training;Therapeutic exercise;Balance training;Patient/family education     PT Goals(Current goals can be found in the care plan section) Acute Rehab PT Goals Patient Stated Goal: to  get therapy at home PT Goal Formulation: With patient Time For Goal Achievement: 03/22/13 Potential to Achieve Goals: Good  Visit Information  Last PT Received On: 03/15/13 Assistance Needed: +1 History of Present Illness: [name]  is an 79 y.o. female lives alone with family close by, hx of CHF, HTN, hx of breast cancer on the left, hypothyroidism on supplement, brought to the ER after she fell and unable to get up all night.  Her daughter came to the house and found her.  She denied loss of consciousness, just unable to get up.  She has pain in her right hip and unable to walk.  Evaluation in the ER included negative right hip xray, negative right knee xray, and negative chest xray. She was found to have rhabdo with CPK of 1700, and normal renal fx tests.         Prior Functioning  Home Living Family/patient expects to be discharged to:: Private residence Living Arrangements: Alone Available Help at Discharge: Family;Available PRN/intermittently Type of Home: House Home Equipment: Bedside commode;Walker - 2 wheels Prior Function Level of Independence: Needs assistance Gait / Transfers Assistance Needed: Pt states she ambulated independently.   ADL's / Homemaking Assistance Needed: Family assists with meals and bathing. Communication Communication: HOH    Cognition  Cognition Arousal/Alertness: Awake/alert Behavior During Therapy: WFL for tasks assessed/performed Overall Cognitive Status: Within Functional Limits for tasks assessed    Extremity/Trunk Assessment Upper Extremity Assessment Upper Extremity Assessment: Defer to OT evaluation Lower Extremity Assessment Lower Extremity Assessment: Generalized weakness   Balance Static Sitting Balance Static Sitting - Balance Support: No upper extremity supported;Feet supported Static Sitting - Level of Assistance: 5: Stand by assistance Static Sitting - Comment/# of Minutes: 3 Static Standing Balance Static Standing - Balance Support: Bilateral upper extremity supported;During functional activity Static Standing - Level of Assistance: 4: Min assist Static Standing - Comment/# of Minutes: Needed assist and cues for steadying with RW.    End of Session PT - End of  Session Equipment Utilized During Treatment: Gait belt Activity Tolerance: Patient limited by fatigue Patient left: in bed;with call bell/phone within reach Nurse Communication: Mobility status;Need for lift equipment       INGOLD,Judith Campillo 03/15/2013, 4:02 PM  [facility] [phone] (pager)

## 2013-03-15 NOTE — ED Provider Notes (Signed)
CSN: [id number]     Arrival date & time 03/14/13  2046 History   First MD Initiated Contact with Patient 03/14/13 2349     Chief Complaint  Patient presents with  . Fall   HPI  History provided by the patient and daughters. Patient is a 79 year old female with past history is of breast cancer, hypertension, CHF who presents from urgent care for further evaluation of recent falls and weakness. Patient's daughter reports that she has 2 recent falls while at home alone. First fall occurred unwitnessed Sunday afternoon. Patient's daughter reports finding the patient on the floor in the hallway. She was able to help her up and later help with her bathing and other ADLs.  Patient was then put to sleep and seemed in fair condition. Patient reports that she rolled out of bed early in the morning approximately between 1 and 2 AM. She reports being caught between the bed and bookcase. Her other daughter arrived to check on her today at 41 AM and found her still the floor. Patient was complaining of pains in her right lower leg and some general weakness. Family continue to try to help her but felt past were much more difficult than normal and patient was having a difficult time standing and walking. Patient was evaluated at urgent care Center and subsequently sent to the emergency room for further evaluations and to rule out possible rhabdomyolysis. Patient denies feeling any chest pains or shortness of breath. She denies any nausea vomiting symptoms. Patient has had slightly increased lower extremity swelling. Family also reports some concern for firm mass of the left breast area. She denies any pain.   Past Medical History  Diagnosis Date  . Essential hypertension, malignant   . Osteoarthrosis, unspecified whether generalized or localized, unspecified site   . Congestive heart failure, unspecified   . Unspecified vitamin D deficiency   . Unspecified hypothyroidism   . Urinary frequency   . Anxiety     Past Surgical History  Procedure Laterality Date  . Breast lumpectomy    . Breast lumpectomy     No family history on file. History  Substance Use Topics  . Smoking status: Former Games developer  . Smokeless tobacco: Not on file  . Alcohol Use: No   OB History   Grav Para Term Preterm Abortions TAB SAB Ect Mult Living                 Review of Systems  Constitutional: Negative for fever.  Respiratory: Negative for shortness of breath.   Cardiovascular: Positive for leg swelling. Negative for chest pain.  Neurological: Positive for weakness. Negative for dizziness, light-headedness, numbness and headaches.  All other systems reviewed and are negative.    Allergies  Codeine  Home Medications   Current Outpatient Rx  Name  Route  Sig  Dispense  Refill  . ALPRAZolam (XANAX) 0.5 MG tablet   Oral   Take 0.5 mg by mouth 2 (two) times daily as needed for anxiety.          . carvedilol (COREG) 3.125 MG tablet   Oral   Take 6.25 mg by mouth daily.          . cholecalciferol (VITAMIN D) 1000 UNITS tablet   Oral   Take 1,000 Units by mouth daily.         Marland Kitchen gabapentin (NEURONTIN) 100 MG capsule   Oral   Take 100 mg by mouth at bedtime.         Marland Kitchen  levothyroxine (SYNTHROID, LEVOTHROID) 88 MCG tablet   Oral   Take 88 mcg by mouth daily.           Marland Kitchen lisinopril (PRINIVIL,ZESTRIL) 10 MG tablet   Oral   Take 10 mg by mouth 2 (two) times daily.           . naproxen (NAPROSYN) 375 MG tablet   Oral   Take 1 tablet (375 mg total) by mouth 2 (two) times daily.   20 tablet   0   . QUEtiapine (SEROQUEL) 25 MG tablet   Oral   Take 25 mg by mouth at bedtime.         . simvastatin (ZOCOR) 20 MG tablet   Oral   Take 40 mg by mouth at bedtime.          Marland Kitchen spironolactone (ALDACTONE) 25 MG tablet   Oral   Take 25 mg by mouth daily.           . traMADol (ULTRAM) 50 MG tablet   Oral   Take 1 tablet (50 mg total) by mouth every 6 (six) hours as needed for pain.    15 tablet   0   . zolpidem (AMBIEN) 5 MG tablet   Oral   Take 5 mg by mouth at bedtime as needed for sleep.          BP 137/67  Pulse 57  Temp(Src) 98.2 F (36.8 C) (Oral)  Resp 18  SpO2 98% Physical Exam  Nursing note and vitals reviewed. Constitutional: She is oriented to person, place, and time. She appears well-developed and well-nourished. No distress.  HENT:  Head: Normocephalic and atraumatic.  Eyes: Conjunctivae and EOM are normal. Pupils are equal, round, and reactive to light.  Neck: Normal range of motion. Neck supple.  No cervical midline tenderness  Cardiovascular: Normal rate and regular rhythm.   Pulmonary/Chest: Effort normal and breath sounds normal. No respiratory distress. She has no wheezes. She has no rales.  Firm nodular structure to the medial left breast. Skin appears normal without erythema. No tenderness.  Abdominal: Soft. There is no tenderness. There is no rebound and no guarding.  Musculoskeletal: She exhibits edema.  There is some tenderness along the right lower extremity in hip area. No gross deformities. No rotation or shortening of the leg. There is significant bilateral pitting edema.  Neurological: She is alert and oriented to person, place, and time. No cranial nerve deficit.  Strength equal bilaterally  Skin: Skin is warm and dry. No rash noted.  Psychiatric: She has a normal mood and affect. Her behavior is normal.    ED Course  Procedures   DIAGNOSTIC STUDIES: Oxygen Saturation is 98% on room air.    COORDINATION OF CARE:  Nursing notes reviewed. Vital signs reviewed. Initial pt interview and examination performed.   12:00 AM vision seen and evaluated. Patient does not appear in any acute distress. Discussed work up plan with pt and family at bedside, which includes additional lab testing, imaging of right hip lower leg. Pt agrees with plan.  Patient was discussed with attending physician. Given elevated CK, age, living alone in  difficulty walking and moving we'll plan on admission.  Spoke with Dr. Conley Rolls with [facility]. He will see patient and admit. He would also like CT scan of the right hip to rule out occult fracture    Results for orders placed during the hospital encounter of 03/14/13  CBC WITH DIFFERENTIAL  Result Value Range   WBC 8.4  4.0 - 10.5 K/uL   RBC 4.31  3.87 - 5.11 MIL/uL   Hemoglobin 13.7  12.0 - 15.0 g/dL   HCT 16.1  09.6 - 04.5 %   MCV 94.7  78.0 - 100.0 fL   MCH 31.8  26.0 - 34.0 pg   MCHC 33.6  30.0 - 36.0 g/dL   RDW 40.9  81.1 - 91.4 %   Platelets 290  150 - 400 K/uL   Neutrophils Relative % 74  43 - 77 %   Neutro Abs 6.2  1.7 - 7.7 K/uL   Lymphocytes Relative 17  12 - 46 %   Lymphs Abs 1.4  0.7 - 4.0 K/uL   Monocytes Relative 8  3 - 12 %   Monocytes Absolute 0.6  0.1 - 1.0 K/uL   Eosinophils Relative 1  0 - 5 %   Eosinophils Absolute 0.1  0.0 - 0.7 K/uL   Basophils Relative 0  0 - 1 %   Basophils Absolute 0.0  0.0 - 0.1 K/uL  COMPREHENSIVE METABOLIC PANEL      Result Value Range   Sodium 140  135 - 145 mEq/L   Potassium 3.6  3.5 - 5.1 mEq/L   Chloride 100  96 - 112 mEq/L   CO2 25  19 - 32 mEq/L   Glucose, Bld 101 (*) 70 - 99 mg/dL   BUN 19  6 - 23 mg/dL   Creatinine, Ser 7.82  0.50 - 1.10 mg/dL   Calcium 9.6  8.4 - 95.6 mg/dL   Total Protein 7.8  6.0 - 8.3 g/dL   Albumin 3.5  3.5 - 5.2 g/dL   AST 44 (*) 0 - 37 U/L   ALT 18  0 - 35 U/L   Alkaline Phosphatase 76  39 - 117 U/L   Total Bilirubin 1.4 (*) 0.3 - 1.2 mg/dL   GFR calc non Af Amer 54 (*) >90 mL/min   GFR calc Af Amer 63 (*) >90 mL/min  PRO B NATRIURETIC PEPTIDE      Result Value Range   Pro B Natriuretic peptide (BNP) 864.2 (*) 0 - 450 pg/mL  CK      Result Value Range   Total CK 1782 (*) 7 - 177 U/L       Imaging Review Dg Chest 2 View  03/15/2013   CLINICAL DATA:  Fall.  Right leg pain.  EXAM: CHEST  2 VIEW  COMPARISON:  05/29/2011  FINDINGS: Shallow inspiration. Heart size and pulmonary  vascularity are normal for technique. Study is technically limited due to overlying soft tissue attenuation and artifact but there is no evidence of any focal consolidation or airspace disease in the lungs. No blunting of costophrenic angles. Surgical clips in the right upper quadrant. Tortuous aorta. Esophageal hiatal hernia behind the heart.  IMPRESSION: No active cardiopulmonary disease.   Electronically Signed   By: Burman Nieves M.D.   On: 03/15/2013 01:15   Dg Hip Complete Right  03/15/2013   CLINICAL DATA:  Fall.  Right leg pain.  EXAM: RIGHT HIP - COMPLETE 2+ VIEW  COMPARISON:  None.  FINDINGS: Probably degenerative changes in the right hip with complete loss of the acetabular joint space and sclerosis on both sides of the joint. There are subchondral cysts in the hip. Avascular necrosis is not excluded. Soft tissue calcifications over the pelvis and hips probably represent injection granulomas. No evidence of acute fracture or subluxation of the  pelvis or hips.  IMPRESSION: Prominent degenerative changes in the right hip. No displaced fractures identified.   Electronically Signed   By: Burman Nieves M.D.   On: 03/15/2013 01:17   Dg Tibia/fibula Right  03/15/2013   CLINICAL DATA:  Right leg pain after a fall.  EXAM: RIGHT TIBIA AND FIBULA - 2 VIEW  COMPARISON:  Right knee 03/11/2005  FINDINGS: Prominent degenerative changes in the right knee with narrowed medial and lateral compartments and associated hypertrophic changes. Degenerative changes in the patellofemoral compartment. Joint space calcifications consistent with chondrocalcinosis. Degenerative changes have progressed since previous study. No significant effusion in the right knee. There is no evidence of acute fracture of the tibia or fibula. Multiple soft tissue calcifications suggest or matter myositis or vascular calcification. Plantar and Achilles calcaneal spurs.  IMPRESSION: Degenerative changes of the right knee with progression.  No acute fractures identified.   Electronically Signed   By: Burman Nieves M.D.   On: 03/15/2013 01:18     MDM   1. Weakness   2. Elevated CK   3. Fall, initial encounter   4. Bilateral lower extremity edema        Angus Seller, PA-C 03/15/13 [phone]

## 2013-03-15 NOTE — Progress Notes (Signed)
Admitted pt from ed awake oriented x4. Assisted to bed. Tele on.  NSR without ectopies.no c/io SOB.oriented to room and room. Observe pt closely

## 2013-03-15 NOTE — ED Provider Notes (Signed)
Medical screening examination/treatment/procedure(s) were conducted as a shared visit with non-physician practitioner(s) and myself.  I personally evaluated the patient during the encounter.  Patient with fall, history of recent increase in falls, increased lower leg swelling.  Patient lives alone, I and  the PA do not feel that she is safe to continue living alone.  Patient was entrapped for 8 hours.  Plan for total CK and x-rays.  Olivia Mackie, MD 03/15/13 [phone]

## 2013-03-15 NOTE — Evaluation (Signed)
Occupational Therapy Evaluation Patient Details Name: [name] MRN: [id number] DOB: 09-Aug-1915 Today's Date: 03/15/2013 Time: 4098-1191 OT Time Calculation (min): 33 min  OT Assessment / Plan / Recommendation History of present illness [name] is an 79 y.o. female lives alone with family close by, hx of CHF, HTN, hx of breast cancer on the left, hypothyroidism on supplement, brought to the ER after she fell and unable to get up all night.  Her daughter came to the house and found her.  She denied loss of consciousness, just unable to get up.  She has pain in her right hip and unable to walk.  Evaluation in the ER included negative right hip xray, negative right knee xray, and negative chest xray. She was found to have rhabdo with CPK of 1700, and normal renal fx tests.     Clinical Impression   Pt admitted with above. Will benefit from continued acute OT services in order to address below problem list.  Pt requires assist with ADLs and for functional stand pivot transfer out of bed.  Pt states that she has family who provides assist as needed but could not specify whether or not she can get 24/7 assist from family.  At this time, recommending SNF to further progress rehab and maximize independence with ADLs prior to return home.  If pt's family is able to provide 24/7 assist then will be able to consider updating d/c rec to home with HHOT.      OT Assessment  Patient needs continued OT Services    Follow Up Recommendations  SNF;Supervision/Assistance - 24 hour    Barriers to Discharge Decreased caregiver support Unsure if pt can get 24/7 supervision/assist from family/friends.  Equipment Recommendations   (TBD next venue)    Recommendations for Other Services    Frequency  Min 2X/week    Precautions / Restrictions Precautions Precautions: Fall   Pertinent Vitals/Pain See vitals    ADL  Eating/Feeding: Performed;Set up Where Assessed - Eating/Feeding: Edge of  bed Grooming: Performed;Wash/dry face;Wash/dry hands;Supervision/safety Where Assessed - Grooming: Unsupported sitting Upper Body Bathing: Performed;Chest;Right arm;Left arm;Abdomen;Supervision/safety Where Assessed - Upper Body Bathing: Unsupported sitting Lower Body Bathing: Performed;Moderate assistance Where Assessed - Lower Body Bathing: Unsupported sitting;Lean right and/or left Upper Body Dressing: Performed;Minimal assistance Where Assessed - Upper Body Dressing: Unsupported sitting Lower Body Dressing: Performed;Maximal assistance Where Assessed - Lower Body Dressing: Unsupported sitting Toilet Transfer: Simulated;Moderate assistance Toilet Transfer Method: Stand pivot Toilet Transfer Equipment:  (bed<>chair) Equipment Used: Gait belt Transfers/Ambulation Related to ADLs: Mod assist for SPT from bed<>chair. ADL Comments: Pt with urinary incontinence in bed upon OT arrival.  Pt sat EOB for bathing and to don clean gown with assist.      OT Diagnosis: Generalized weakness  OT Problem List: Decreased strength;Decreased activity tolerance;Impaired balance (sitting and/or standing);Decreased knowledge of use of DME or AE OT Treatment Interventions: Self-care/ADL training;DME and/or AE instruction;Therapeutic activities;Patient/family education;Balance training   OT Goals(Current goals can be found in the care plan section) Acute Rehab OT Goals Patient Stated Goal: to get therapy at home OT Goal Formulation: With patient Time For Goal Achievement: 03/29/13 Potential to Achieve Goals: Good  Visit Information  Last OT Received On: 03/15/13 Assistance Needed: +1 History of Present Illness: [name] is an 79 y.o. female lives alone with family close by, hx of CHF, HTN, hx of breast cancer on the left, hypothyroidism on supplement, brought to the ER after she fell and unable to get up  all night.  Her daughter came to the house and found her.  She denied loss of consciousness,  just unable to get up.  She has pain in her right hip and unable to walk.  Evaluation in the ER included negative right hip xray, negative right knee xray, and negative chest xray. She was found to have rhabdo with CPK of 1700, and normal renal fx tests.         Prior Functioning     Home Living Family/patient expects to be discharged to:: Private residence Living Arrangements: Alone Available Help at Discharge: Family;Available PRN/intermittently Type of Home: House Home Equipment: Bedside commode;Walker - 2 wheels Prior Function Level of Independence: Needs assistance ADL's / Homemaking Assistance Needed: Family assists with meals and bathing. Communication Communication: HOH         Vision/Perception     Cognition  Cognition Arousal/Alertness: Awake/alert Behavior During Therapy: WFL for tasks assessed/performed Overall Cognitive Status: Within Functional Limits for tasks assessed    Extremity/Trunk Assessment Upper Extremity Assessment Upper Extremity Assessment: Generalized weakness     Mobility Bed Mobility Bed Mobility: Supine to Sit;Sitting - Scoot to Edge of Bed Supine to Sit: 2: Max assist;HOB elevated;With rails Sitting - Scoot to Edge of Bed: 3: Mod assist Details for Bed Mobility Assistance: Assist to bring LEs to EOB and to support trunk. Use of draw pad to pivot hips toward EOB. Incr time due to stiff/sore from laying in bed. Transfers Transfers: Sit to Stand;Stand to Sit Sit to Stand: 3: Mod assist;From bed;With upper extremity assist Stand to Sit: 3: Mod assist;To chair/3-in-1;With armrests;With upper extremity assist Details for Transfer Assistance: Assist for power up from bed and to provide support for balance once standing.     Exercise     Balance Balance Balance Assessed: Yes Static Sitting Balance Static Sitting - Balance Support: No upper extremity supported;Feet supported Static Sitting - Level of Assistance: 5: Stand by  assistance Dynamic Sitting Balance Dynamic Sitting - Balance Support: No upper extremity supported;Feet supported;During functional activity Dynamic Sitting - Level of Assistance: 5: Stand by assistance Dynamic Sitting Balance - Compensations: Close supervision for safety due to pt leans posteriorly during bathing tasks EOB.  However, did not lose balance. Static Standing Balance Static Standing - Balance Support: Bilateral upper extremity supported Static Standing - Level of Assistance: 3: Mod assist Static Standing - Comment/# of Minutes: Bil HHA for support while standing at bedside.   End of Session OT - End of Session Equipment Utilized During Treatment: Gait belt Activity Tolerance: Patient tolerated treatment well Patient left: in chair;with call bell/phone within reach;with nursing/sitter in room Nurse Communication: Mobility status  GO    03/15/2013 Cipriano Mile OTR/L Pager [phone] Office [phone]  Cipriano Mile 03/15/2013, 10:52 AM

## 2013-03-15 NOTE — Progress Notes (Signed)
Patient seen and examined. Admitted after midnight after she was found on the floor after mechanical fall. Patient complaining of right hip and knee pain. No fractures identified. Also with rhabdomyolysis. Please refer to Dr. Conley Rolls H&P for further info/details on admission.  Plan: -PT/OT has recommended SNF for rehab and care -SW aware and consulted for assistance with placement if family and patient in agreement (no family at bedside when patient evaluated) -patient with left breast firmness and hx of breast cancer (concerns for recurrent malignancy). Surgery consulted for evaluation and recommendations. -follow CK -follow BMET   [name] [phone]

## 2013-03-16 LAB — BASIC METABOLIC PANEL
Calcium: 7.8 mg/dL — ABNORMAL LOW (ref 8.4–10.5)
Chloride: 111 mEq/L (ref 96–112)
Creatinine, Ser: 0.68 mg/dL (ref 0.50–1.10)
GFR calc Af Amer: 84 mL/min — ABNORMAL LOW (ref >90)
GFR calc non Af Amer: 72 mL/min — ABNORMAL LOW (ref >90)
Sodium: 145 mEq/L (ref 135–145)

## 2013-03-16 LAB — CK: Total CK: 1376 U/L — ABNORMAL HIGH (ref 7–177)

## 2013-03-16 MED ORDER — INSULIN DETEMIR 100 UNIT/ML ~~LOC~~ SOLN
20.0000 [IU] | Freq: Every day | SUBCUTANEOUS | Status: DC
  Administered 2013-03-16: 09:00:00 20 [IU] via SUBCUTANEOUS
  Filled 2013-03-16: qty 0.2

## 2013-03-16 MED ORDER — METFORMIN HCL 1000 MG PO TABS: 1000.0000 mg | ORAL_TABLET | Freq: Two times a day (BID) | ORAL | Status: DC

## 2013-03-16 MED ORDER — FUROSEMIDE 10 MG/ML IJ SOLN
20.0000 mg | Freq: Once | INTRAMUSCULAR | Status: AC
Start: 2013-03-16 — End: 2013-03-16
  Administered 2013-03-16: 10:00:00 20 mg via INTRAVENOUS
  Filled 2013-03-16: qty 2

## 2013-03-16 MED ORDER — INSULIN ASPART 100 UNIT/ML ~~LOC~~ SOLN
3.0000 [IU] | Freq: Three times a day (TID) | SUBCUTANEOUS | Status: DC
  Administered 2013-03-16: 12:00:00 3 [IU] via SUBCUTANEOUS

## 2013-03-16 MED ORDER — INSULIN ASPART 100 UNIT/ML ~~LOC~~ SOLN: 0.0000 [IU] | Freq: Every day | SUBCUTANEOUS | Status: DC

## 2013-03-16 MED ORDER — SPIRONOLACTONE 25 MG PO TABS
25.0000 mg | ORAL_TABLET | Freq: Every day | ORAL | Status: DC
  Administered 2013-03-16: 25 mg via ORAL
  Filled 2013-03-16: qty 1

## 2013-03-16 MED ORDER — INSULIN ASPART 100 UNIT/ML ~~LOC~~ SOLN
0.0000 [IU] | Freq: Three times a day (TID) | SUBCUTANEOUS | Status: DC
  Administered 2013-03-16: 1 [IU] via SUBCUTANEOUS

## 2013-03-16 MED ORDER — POTASSIUM CHLORIDE CRYS ER 20 MEQ PO TBCR
40.0000 meq | EXTENDED_RELEASE_TABLET | Freq: Three times a day (TID) | ORAL | Status: DC
  Administered 2013-03-16: 40 meq via ORAL
  Filled 2013-03-16: qty 2

## 2013-03-16 NOTE — Progress Notes (Signed)
PT Cancellation Note  Patient Details Name: [name] MRN: [id number] DOB: Nov 23, 1915   Cancelled Treatment:    Reason Eval/Treat Not Completed:  (Pt refused.  States she doesnt feel well.)   INGOLD,Osias Resnick 03/16/2013, 10:22 AM [facility] [phone] (pager)

## 2013-03-16 NOTE — Progress Notes (Addendum)
[facility] PROGRESS NOTE Interim History: 79 y.o. female lives alone with family close by, hx of CHF, HTN, hx of breast cancer on the left, hypothyroidism on supplement, brought to the ER after she fell and unable to get up all night. Her daughter came to the house and found her. She denied loss of consciousness, just unable to get up. She has pain in her right hip and unable to walk. Evaluation in the ER included negative right hip xray, negative right knee xray, and negative chest xray. She was found to have rhabdo with CPK of 1700    Assessment/Plan: *Rhabdomyolysis: - Cont IV fluids, check a CK. CR stable. - b-met. - Pt recommended SNF, want to go home.  Fall at home/ Hip pain, acute - CT shwoed no hip fracture.  HTN (hypertension)/History of CHF (congestive heart failure) - resume home meds.  Hypothyroidism - check a TSH.    Code Status: DNR Family Communication: none  Disposition Plan: inpatient   Consultants:  none  Procedures:  CT leg.  Left hip x-ray  Antibiotics:  None  HPI/Subjective: Complaining of left hip pain  Objective: Filed Vitals:   03/15/13 1300 03/15/13 1700 03/15/13 2107 03/16/13 0451  BP: 129/49 130/67 125/45 125/41  Pulse: 66 70 72 73  Temp: 98 F (36.7 C) 98 F (36.7 C) 98.2 F (36.8 C) 98.3 F (36.8 C)  TempSrc: Oral Oral Oral Oral  Resp: 18 18 19 19   Height:      Weight:    78.654 kg (173 lb 6.4 oz)  SpO2: 100% 100% 100% 96%    Intake/Output Summary (Last 24 hours) at 03/16/13 0735 Last data filed at 03/16/13 0641  Gross per 24 hour  Intake    880 ml  Output      0 ml  Net    880 ml   Filed Weights   03/15/13 0440 03/16/13 0451  Weight: 76.3 kg (168 lb 3.4 oz) 78.654 kg (173 lb 6.4 oz)    Exam:  General: Alert, awake, oriented x3, in no acute distress.  HEENT: No bruits, no goiter.  Heart: Regular rate and rhythm, without murmurs, rubs, gallops.  Lungs: Good air movement, clear to auscultation Abdomen:  Soft, nontender, nondistended, positive bowel sounds.  Neuro: Grossly intact, nonfocal.   Data Reviewed: Basic Metabolic Panel:  Recent Labs Lab 03/14/13 2105  NA 140  K 3.6  CL 100  CO2 25  GLUCOSE 101*  BUN 19  CREATININE 0.88  CALCIUM 9.6   Liver Function Tests:  Recent Labs Lab 03/14/13 2105  AST 44*  ALT 18  ALKPHOS 76  BILITOT 1.4*  PROT 7.8  ALBUMIN 3.5   No results found for this basename: LIPASE, AMYLASE,  in the last 168 hours No results found for this basename: AMMONIA,  in the last 168 hours CBC:  Recent Labs Lab 03/14/13 2105  WBC 8.4  NEUTROABS 6.2  HGB 13.7  HCT 40.8  MCV 94.7  PLT 290   Cardiac Enzymes:  Recent Labs Lab 03/15/13 0042  CKTOTAL 1782*   BNP (last 3 results)  Recent Labs  03/15/13 0042  PROBNP 864.2*   CBG: No results found for this basename: GLUCAP,  in the last 168 hours  No results found for this or any previous visit (from the past 240 hour(s)).   Studies: Dg Chest 2 View  03/15/2013   CLINICAL DATA:  Fall.  Right leg pain.  EXAM: CHEST  2 VIEW  COMPARISON:  05/29/2011  FINDINGS: Shallow inspiration. Heart size and pulmonary vascularity are normal for technique. Study is technically limited due to overlying soft tissue attenuation and artifact but there is no evidence of any focal consolidation or airspace disease in the lungs. No blunting of costophrenic angles. Surgical clips in the right upper quadrant. Tortuous aorta. Esophageal hiatal hernia behind the heart.  IMPRESSION: No active cardiopulmonary disease.   Electronically Signed   By: Burman Nieves M.D.   On: 03/15/2013 01:15   Dg Hip Complete Right  03/15/2013   CLINICAL DATA:  Fall.  Right leg pain.  EXAM: RIGHT HIP - COMPLETE 2+ VIEW  COMPARISON:  None.  FINDINGS: Probably degenerative changes in the right hip with complete loss of the acetabular joint space and sclerosis on both sides of the joint. There are subchondral cysts in the hip. Avascular  necrosis is not excluded. Soft tissue calcifications over the pelvis and hips probably represent injection granulomas. No evidence of acute fracture or subluxation of the pelvis or hips.  IMPRESSION: Prominent degenerative changes in the right hip. No displaced fractures identified.   Electronically Signed   By: Burman Nieves M.D.   On: 03/15/2013 01:17   Dg Tibia/fibula Right  03/15/2013   CLINICAL DATA:  Right leg pain after a fall.  EXAM: RIGHT TIBIA AND FIBULA - 2 VIEW  COMPARISON:  Right knee 03/11/2005  FINDINGS: Prominent degenerative changes in the right knee with narrowed medial and lateral compartments and associated hypertrophic changes. Degenerative changes in the patellofemoral compartment. Joint space calcifications consistent with chondrocalcinosis. Degenerative changes have progressed since previous study. No significant effusion in the right knee. There is no evidence of acute fracture of the tibia or fibula. Multiple soft tissue calcifications suggest or matter myositis or vascular calcification. Plantar and Achilles calcaneal spurs.  IMPRESSION: Degenerative changes of the right knee with progression. No acute fractures identified.   Electronically Signed   By: Burman Nieves M.D.   On: 03/15/2013 01:18   Ct Hip Right Wo Contrast  03/15/2013   CLINICAL DATA:  Patient fell last night. Progressive lower leg swelling for 2 months.  EXAM: CT OF THE RIGHT HIP WITHOUT CONTRAST  TECHNIQUE: Multidetector CT imaging was performed according to the standard protocol. Multiplanar CT image reconstructions were also generated.  COMPARISON:  Right hip radiographs 03/15/2013  FINDINGS: Prominent degenerative changes in the right hip with near complete loss of the acetabular joint space and with remodeling on the acetabular and femoral surface. There are multiple subcortical cyst demonstrated on the acetabular and femoral surfaces. Prominent hypertrophic changes are present. Dystrophic calcification  and heterotopic calcification is noted. No evidence of acute fracture or subluxation of the right hip. Visualized portions of the pelvis, including the superior and inferior pubic rami appear intact without displaced fracture identified. No significant soft tissue hematoma.  IMPRESSION: Marked degenerative changes in the right hip. No evidence of acute fracture or dislocation.   Electronically Signed   By: Burman Nieves M.D.   On: 03/15/2013 03:56    Scheduled Meds: . carvedilol  6.25 mg Oral Q breakfast  . cholecalciferol  1,000 Units Oral Daily  . docusate sodium  100 mg Oral BID  . gabapentin  100 mg Oral QHS  . heparin  5,000 Units Subcutaneous Q8H  . levothyroxine  88 mcg Oral QAC breakfast  . lisinopril  10 mg Oral BID  . QUEtiapine  25 mg Oral QHS   Continuous Infusions: . dextrose 5 % and 0.9% NaCl 50 mL/hr  at 03/16/13 0115     Marinda Elk  [facility] Pager [phone]. If 8PM-8AM, please contact night-coverage at [email or web address], password [email or web address] 03/16/2013, 7:35 AM  LOS: 2 days

## 2013-03-16 NOTE — Discharge Summary (Signed)
Physician Discharge Summary  [name] NUU:[id number] DOB: 10-26-1915 DOA: 03/14/2013  PCP: Willey Blade, MD  Admit date: 03/14/2013 Discharge date: 03/16/2013  Time spent: 40 minutes  Recommendations for Outpatient Follow-up:  1. Follow up with PCP in 2 weeks. 2. Check b-met and blood glucose.  Discharge Diagnoses:  Principal Problem:   Rhabdomyolysis Active Problems:   Fall at home   Hip pain, acute   HTN (hypertension)   History of CHF (congestive heart failure)   Hypothyroidism   Breast mass, left   DNR no code (do not resuscitate)   Discharge Condition: Guarded  Diet recommendation: carb modified diet  Filed Weights   03/15/13 0440 03/16/13 0451  Weight: 76.3 kg (168 lb 3.4 oz) 78.654 kg (173 lb 6.4 oz)    History of present illness:  79 y.o. female lives alone with family close by, hx of CHF, HTN, hx of breast cancer on the left, hypothyroidism on supplement, brought to the ER after she fell and unable to get up all night. Her daughter came to the house and found her. She denied loss of consciousness, just unable to get up. She has pain in her right hip and unable to walk. Evaluation in the ER included negative right hip xray, negative right knee xray, and negative chest xray. She was found to have rhabdo with CPK of 1700, and normal renal fx tests. She has normal CBC. Family also was concerned about her left breast firmness. Hospitalist was asked to admit her because she can't walk and having rhabdo. She denied fever, chills, HA, nausea, vomiting, chest pain, palpitation or shortness of breath   Hospital Course:  *Rhabdomyolysis:  - Resolved with IV fluids, CK trending down. Cr stable.  - This was probably due to her being on the floor for a prolong period of time. - Pt recommended SNF, but she want to go home. - Send Child psychotherapist and PT home.  Fall at home/ Hip pain, acute  - CT showed no hip fracture.   HTN (hypertension)/History of CHF (congestive heart  failure)  - resume home meds.   Hypothyroidism  - check a TSH.    Procedures:  CT right hip  CXR  Consultations:  none  Discharge Exam: Filed Vitals:   03/16/13 0900  BP: 108/83  Pulse: 65  Temp: 97.3 F (36.3 C)  Resp: 18    General: A&O x3 Cardiovascular: RRR Respiratory: good air movement CAT B/l  Discharge Instructions  Discharge Orders   Future Orders Complete By Expires   Diet - low sodium heart healthy  As directed    Increase activity slowly  As directed        Medication List         ALPRAZolam 0.5 MG tablet  Commonly known as:  XANAX  Take 0.5 mg by mouth 2 (two) times daily as needed for anxiety.     carvedilol 3.125 MG tablet  Commonly known as:  COREG  Take 6.25 mg by mouth daily.     cholecalciferol 1000 UNITS tablet  Commonly known as:  VITAMIN D  Take 1,000 Units by mouth daily.     gabapentin 100 MG capsule  Commonly known as:  NEURONTIN  Take 100 mg by mouth at bedtime.     hydrOXYzine 10 MG tablet  Commonly known as:  ATARAX/VISTARIL  Take 10 mg by mouth 3 (three) times daily as needed for itching.     levothyroxine 88 MCG tablet  Commonly known as:  SYNTHROID, LEVOTHROID  Take 88 mcg by mouth daily.     lisinopril 10 MG tablet  Commonly known as:  PRINIVIL,ZESTRIL  Take 10 mg by mouth 2 (two) times daily.     metFORMIN 1000 MG tablet  Commonly known as:  GLUCOPHAGE  Take 1 tablet (1,000 mg total) by mouth 2 (two) times daily with a meal.     naproxen 375 MG tablet  Commonly known as:  NAPROSYN  Take 1 tablet (375 mg total) by mouth 2 (two) times daily.     QUEtiapine 25 MG tablet  Commonly known as:  SEROQUEL  Take 25 mg by mouth at bedtime.     simvastatin 20 MG tablet  Commonly known as:  ZOCOR  Take 40 mg by mouth at bedtime.     spironolactone 25 MG tablet  Commonly known as:  ALDACTONE  Take 25 mg by mouth daily.     traMADol 50 MG tablet  Commonly known as:  ULTRAM  Take 1 tablet (50 mg total) by  mouth every 6 (six) hours as needed for pain.     zolpidem 5 MG tablet  Commonly known as:  AMBIEN  Take 5 mg by mouth at bedtime as needed for sleep.       Allergies  Allergen Reactions  . Codeine Nausea And Vomiting       Follow-up Information   Follow up with August Saucer, ERIC, MD In 2 weeks. (hospital follow up)    Specialty:  Internal Medicine   Contact information:   [facility] [address] [place] [address] [phone]        The results of significant diagnostics from this hospitalization (including imaging, microbiology, ancillary and laboratory) are listed below for reference.    Significant Diagnostic Studies: Dg Chest 2 View  03/15/2013   CLINICAL DATA:  Fall.  Right leg pain.  EXAM: CHEST  2 VIEW  COMPARISON:  05/29/2011  FINDINGS: Shallow inspiration. Heart size and pulmonary vascularity are normal for technique. Study is technically limited due to overlying soft tissue attenuation and artifact but there is no evidence of any focal consolidation or airspace disease in the lungs. No blunting of costophrenic angles. Surgical clips in the right upper quadrant. Tortuous aorta. Esophageal hiatal hernia behind the heart.  IMPRESSION: No active cardiopulmonary disease.   Electronically Signed   By: Burman Nieves M.D.   On: 03/15/2013 01:15   Dg Hip Complete Right  03/15/2013   CLINICAL DATA:  Fall.  Right leg pain.  EXAM: RIGHT HIP - COMPLETE 2+ VIEW  COMPARISON:  None.  FINDINGS: Probably degenerative changes in the right hip with complete loss of the acetabular joint space and sclerosis on both sides of the joint. There are subchondral cysts in the hip. Avascular necrosis is not excluded. Soft tissue calcifications over the pelvis and hips probably represent injection granulomas. No evidence of acute fracture or subluxation of the pelvis or hips.  IMPRESSION: Prominent degenerative changes in the right hip. No displaced fractures identified.    Electronically Signed   By: Burman Nieves M.D.   On: 03/15/2013 01:17   Dg Tibia/fibula Right  03/15/2013   CLINICAL DATA:  Right leg pain after a fall.  EXAM: RIGHT TIBIA AND FIBULA - 2 VIEW  COMPARISON:  Right knee 03/11/2005  FINDINGS: Prominent degenerative changes in the right knee with narrowed medial and lateral compartments and associated hypertrophic changes. Degenerative changes in the patellofemoral compartment. Joint space calcifications consistent with  chondrocalcinosis. Degenerative changes have progressed since previous study. No significant effusion in the right knee. There is no evidence of acute fracture of the tibia or fibula. Multiple soft tissue calcifications suggest or matter myositis or vascular calcification. Plantar and Achilles calcaneal spurs.  IMPRESSION: Degenerative changes of the right knee with progression. No acute fractures identified.   Electronically Signed   By: Burman Nieves M.D.   On: 03/15/2013 01:18   Ct Hip Right Wo Contrast  03/15/2013   CLINICAL DATA:  Patient fell last night. Progressive lower leg swelling for 2 months.  EXAM: CT OF THE RIGHT HIP WITHOUT CONTRAST  TECHNIQUE: Multidetector CT imaging was performed according to the standard protocol. Multiplanar CT image reconstructions were also generated.  COMPARISON:  Right hip radiographs 03/15/2013  FINDINGS: Prominent degenerative changes in the right hip with near complete loss of the acetabular joint space and with remodeling on the acetabular and femoral surface. There are multiple subcortical cyst demonstrated on the acetabular and femoral surfaces. Prominent hypertrophic changes are present. Dystrophic calcification and heterotopic calcification is noted. No evidence of acute fracture or subluxation of the right hip. Visualized portions of the pelvis, including the superior and inferior pubic rami appear intact without displaced fracture identified. No significant soft tissue hematoma.  IMPRESSION:  Marked degenerative changes in the right hip. No evidence of acute fracture or dislocation.   Electronically Signed   By: Burman Nieves M.D.   On: 03/15/2013 03:56    Microbiology: No results found for this or any previous visit (from the past 240 hour(s)).   Labs: Basic Metabolic Panel:  Recent Labs Lab 03/14/13 2105 03/16/13 0600  NA 140 145  K 3.6 2.8*  CL 100 111  CO2 25 24  GLUCOSE 101* 553*  BUN 19 13  CREATININE 0.88 0.68  CALCIUM 9.6 7.8*   Liver Function Tests:  Recent Labs Lab 03/14/13 2105  AST 44*  ALT 18  ALKPHOS 76  BILITOT 1.4*  PROT 7.8  ALBUMIN 3.5   No results found for this basename: LIPASE, AMYLASE,  in the last 168 hours No results found for this basename: AMMONIA,  in the last 168 hours CBC:  Recent Labs Lab 03/14/13 2105  WBC 8.4  NEUTROABS 6.2  HGB 13.7  HCT 40.8  MCV 94.7  PLT 290   Cardiac Enzymes:  Recent Labs Lab 03/15/13 0042 03/16/13 0600  CKTOTAL 1782* 1376*   BNP: BNP (last 3 results)  Recent Labs  03/15/13 0042  PROBNP 864.2*   CBG: No results found for this basename: GLUCAP,  in the last 168 hours     Signed:  Marinda Elk  [facility] 03/16/2013, 10:40 AM

## 2013-03-16 NOTE — Care Management Note (Signed)
    Page 1 of 2   03/16/2013     11:11:01 AM   CARE MANAGEMENT NOTE 03/16/2013  Patient:  [name], [name]   Account Number:  [id number]  Date Initiated:  03/16/2013  Documentation initiated by:  [facility]  Subjective/Objective Assessment:   79 y.o. female lives alone with family close by, hx of CHF, HTN, hx of breast cancer on the left, hypothyroidism on supplement, brought to the ER after she fell and unable to get up all night.     Action/Plan:   Gentle IVF, CT hip//SNF vs HH   Anticipated DC Date:  03/16/2013   Anticipated DC Plan:  HOME W HOME HEALTH SERVICES  In-house referral  Clinical Social Worker      DC Associate Professor  CM consult      [facility] Choice  HOME HEALTH   Choice offered to / List presented to:  C-1 Patient        HH arranged  HH-1 RN  HH-2 PT  HH-4 NURSE'S AIDE  HH-6 SOCIAL WORKER      HH agency  [facility]   Status of service:  Completed, signed off Medicare Important Message given?   (If response is "NO", the following Medicare IM given date fields will be blank) Date Medicare IM given:   Date Additional Medicare IM given:    Discharge Disposition:    Per UR Regulation:    If discussed at Long Length of Stay Meetings, dates discussed:    Comments:  03/16/13 1015 Oletta Cohn, RN, BSN, NCM [phone] NCM spoke with patient concerning discharge planning. Pt refused SNF placement as suggested by PT.   Pt offered choice for [facility] for [facility] services upon discharge. Per pt choice [facility] to provide [facility] services.  [facility] rep Kizzie Furnish, RN contacted concerning new referral. Pt to discharge home alone (children nearby). Pt request HHRN for disease management, PT, NA and SW upon discharge. Pt states she will consider SNF placement from home.   No DME needs identified, pt has a walker, scooter and three canes at home.

## 2013-03-18 LAB — GLUCOSE, CAPILLARY: Glucose-Capillary: 121 mg/dL — ABNORMAL HIGH (ref 70–99)

## 2013-03-19 NOTE — Progress Notes (Signed)
CSW referred to assist this 79 year old female with SNF placement.  Patient lives alone and was found on the floor by family.  Patient relates that she will not agree to placement at this time due to the fact that it is the holidays and she wants to "lock things up"  (she wants to get her home in order). She states that she feels she will need placement in the future and agreed to consider in a few weeks.  CSW explained that she might not meet criteria for placement under her insurance for SNF and may have to seek placement in assisted living. She verbalized understanding.  PASARR and FL2 initiated in case patient has readmission to the hospital.  [facility] is aware and will complete Home Health arrangements for PT, RN , Aide and Child psychotherapist. No further CSW needs identified. CSW signing off.  Lorri Frederick. [place]  [phone]

## 2013-03-23 ENCOUNTER — Other Ambulatory Visit: Payer: Self-pay | Admitting: Internal Medicine

## 2013-03-23 DIAGNOSIS — Z9889 Other specified postprocedural states: Secondary | ICD-10-CM

## 2013-03-23 DIAGNOSIS — N6320 Unspecified lump in the left breast, unspecified quadrant: Secondary | ICD-10-CM

## 2013-03-23 DIAGNOSIS — Z853 Personal history of malignant neoplasm of breast: Secondary | ICD-10-CM

## 2013-03-31 ENCOUNTER — Ambulatory Visit
Admission: RE | Admit: 2013-03-31 | Discharge: 2013-03-31 | Disposition: A | Discharge status: Home / Self Care | Payer: BLUE CROSS/BLUE SHIELD | Source: Ambulatory Visit | Attending: Internal Medicine | Admitting: Internal Medicine

## 2013-03-31 DIAGNOSIS — N6320 Unspecified lump in the left breast, unspecified quadrant: Secondary | ICD-10-CM

## 2013-03-31 DIAGNOSIS — Z9889 Other specified postprocedural states: Secondary | ICD-10-CM

## 2013-03-31 DIAGNOSIS — Z853 Personal history of malignant neoplasm of breast: Secondary | ICD-10-CM

## 2014-04-18 ENCOUNTER — Emergency Department (HOSPITAL_COMMUNITY)
Admission: EM | Admit: 2014-04-18 | Discharge: 2014-04-18 | Disposition: A | Discharge status: Home / Self Care | Payer: Medicare Other | Attending: Emergency Medicine | Admitting: Emergency Medicine

## 2014-04-18 ENCOUNTER — Encounter (HOSPITAL_COMMUNITY): Payer: Self-pay | Admitting: *Deleted

## 2014-04-18 DIAGNOSIS — Y9389 Activity, other specified: Secondary | ICD-10-CM | POA: Diagnosis not present

## 2014-04-18 DIAGNOSIS — T24211A Burn of second degree of right thigh, initial encounter: Secondary | ICD-10-CM | POA: Insufficient documentation

## 2014-04-18 DIAGNOSIS — Z853 Personal history of malignant neoplasm of breast: Secondary | ICD-10-CM | POA: Diagnosis not present

## 2014-04-18 DIAGNOSIS — I10 Essential (primary) hypertension: Secondary | ICD-10-CM | POA: Insufficient documentation

## 2014-04-18 DIAGNOSIS — E039 Hypothyroidism, unspecified: Secondary | ICD-10-CM | POA: Insufficient documentation

## 2014-04-18 DIAGNOSIS — T2122XA Burn of second degree of abdominal wall, initial encounter: Secondary | ICD-10-CM | POA: Diagnosis not present

## 2014-04-18 DIAGNOSIS — I509 Heart failure, unspecified: Secondary | ICD-10-CM | POA: Insufficient documentation

## 2014-04-18 DIAGNOSIS — T24212A Burn of second degree of left thigh, initial encounter: Secondary | ICD-10-CM | POA: Insufficient documentation

## 2014-04-18 DIAGNOSIS — Z87891 Personal history of nicotine dependence: Secondary | ICD-10-CM | POA: Insufficient documentation

## 2014-04-18 DIAGNOSIS — Y998 Other external cause status: Secondary | ICD-10-CM | POA: Insufficient documentation

## 2014-04-18 DIAGNOSIS — F419 Anxiety disorder, unspecified: Secondary | ICD-10-CM | POA: Diagnosis not present

## 2014-04-18 DIAGNOSIS — Z791 Long term (current) use of non-steroidal anti-inflammatories (NSAID): Secondary | ICD-10-CM | POA: Insufficient documentation

## 2014-04-18 DIAGNOSIS — Z79899 Other long term (current) drug therapy: Secondary | ICD-10-CM | POA: Diagnosis not present

## 2014-04-18 DIAGNOSIS — Z23 Encounter for immunization: Secondary | ICD-10-CM | POA: Insufficient documentation

## 2014-04-18 DIAGNOSIS — T2121XA Burn of second degree of chest wall, initial encounter: Secondary | ICD-10-CM | POA: Insufficient documentation

## 2014-04-18 DIAGNOSIS — X100XXA Contact with hot drinks, initial encounter: Secondary | ICD-10-CM | POA: Insufficient documentation

## 2014-04-18 DIAGNOSIS — E559 Vitamin D deficiency, unspecified: Secondary | ICD-10-CM | POA: Insufficient documentation

## 2014-04-18 DIAGNOSIS — E119 Type 2 diabetes mellitus without complications: Secondary | ICD-10-CM | POA: Insufficient documentation

## 2014-04-18 DIAGNOSIS — Y9289 Other specified places as the place of occurrence of the external cause: Secondary | ICD-10-CM | POA: Insufficient documentation

## 2014-04-18 DIAGNOSIS — M1990 Unspecified osteoarthritis, unspecified site: Secondary | ICD-10-CM | POA: Insufficient documentation

## 2014-04-18 DIAGNOSIS — T300 Burn of unspecified body region, unspecified degree: Secondary | ICD-10-CM

## 2014-04-18 HISTORY — DX: Type 2 diabetes mellitus without complications: E11.9

## 2014-04-18 HISTORY — DX: Malignant neoplasm of unspecified site of unspecified female breast: C50.919

## 2014-04-18 LAB — CBC WITH DIFFERENTIAL/PLATELET
Basophils Absolute: 0 10*3/uL (ref 0.0–0.1)
Basophils Relative: 0 % (ref 0–1)
Eosinophils Absolute: 0.2 10*3/uL (ref 0.0–0.7)
Eosinophils Relative: 3 % (ref 0–5)
HCT: 36.1 % (ref 36.0–46.0)
Hemoglobin: 11.4 g/dL — ABNORMAL LOW (ref 12.0–15.0)
Lymphocytes Relative: 21 % (ref 12–46)
Lymphs Abs: 1.6 10*3/uL (ref 0.7–4.0)
MCH: 30.2 pg (ref 26.0–34.0)
MCHC: 31.6 g/dL (ref 30.0–36.0)
MCV: 95.8 fL (ref 78.0–100.0)
Monocytes Absolute: 0.7 10*3/uL (ref 0.1–1.0)
Monocytes Relative: 9 % (ref 3–12)
Neutro Abs: 5.1 10*3/uL (ref 1.7–7.7)
Neutrophils Relative %: 67 % (ref 43–77)
Platelets: 256 10*3/uL (ref 150–400)
RBC: 3.77 MIL/uL — ABNORMAL LOW (ref 3.87–5.11)
RDW: 14.2 % (ref 11.5–15.5)
WBC: 7.7 10*3/uL (ref 4.0–10.5)

## 2014-04-18 LAB — COMPREHENSIVE METABOLIC PANEL
ALK PHOS: 70 U/L (ref 39–117)
ALT: 16 U/L (ref 0–35)
AST: 21 U/L (ref 0–37)
Albumin: 3 g/dL — ABNORMAL LOW (ref 3.5–5.2)
Anion gap: 9 (ref 5–15)
BILIRUBIN TOTAL: 0.5 mg/dL (ref 0.3–1.2)
BUN: 16 mg/dL (ref 6–23)
CHLORIDE: 107 meq/L (ref 96–112)
CO2: 25 mmol/L (ref 19–32)
Calcium: 9.6 mg/dL (ref 8.4–10.5)
Creatinine, Ser: 0.71 mg/dL (ref 0.50–1.10)
GFR, EST AFRICAN AMERICAN: 82 mL/min — AB (ref >90)
GFR, EST NON AFRICAN AMERICAN: 71 mL/min — AB (ref >90)
GLUCOSE: 82 mg/dL (ref 70–99)
POTASSIUM: 4.1 mmol/L (ref 3.5–5.1)
Sodium: 141 mmol/L (ref 135–145)
Total Protein: 6.5 g/dL (ref 6.0–8.3)

## 2014-04-18 LAB — CBG MONITORING, ED: Glucose-Capillary: 76 mg/dL (ref 70–99)

## 2014-04-18 MED ORDER — SILVER SULFADIAZINE 1 % EX CREA: TOPICAL_CREAM | Freq: Every day | CUTANEOUS | Status: DC

## 2014-04-18 MED ORDER — OXYCODONE-ACETAMINOPHEN 5-325 MG PO TABS: 1.0000 | ORAL_TABLET | Freq: Four times a day (QID) | ORAL | Status: DC | PRN

## 2014-04-18 MED ORDER — OXYCODONE-ACETAMINOPHEN 5-325 MG PO TABS
1.0000 | ORAL_TABLET | Freq: Once | ORAL | Status: AC
  Administered 2014-04-18: 1 via ORAL
  Filled 2014-04-18: qty 1

## 2014-04-18 MED ORDER — ONDANSETRON 4 MG PO TBDP
4.0000 mg | ORAL_TABLET | Freq: Once | ORAL | Status: AC
  Administered 2014-04-18: 4 mg via ORAL
  Filled 2014-04-18: qty 1

## 2014-04-18 MED ORDER — SILVER SULFADIAZINE 1 % EX CREA
TOPICAL_CREAM | Freq: Once | CUTANEOUS | Status: AC
  Administered 2014-04-18: 1 via TOPICAL
  Filled 2014-04-18: qty 85

## 2014-04-18 MED ORDER — TETANUS-DIPHTHERIA TOXOIDS TD 5-2 LFU IM INJ
0.5000 mL | INJECTION | Freq: Once | INTRAMUSCULAR | Status: AC
  Administered 2014-04-18: 0.5 mL via INTRAMUSCULAR
  Filled 2014-04-18: qty 0.5
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
## 2014-05-12 ENCOUNTER — Emergency Department (HOSPITAL_COMMUNITY)
Admission: EM | Admit: 2014-05-12 | Discharge: 2014-05-12 | Disposition: A | Discharge status: Home / Self Care | Payer: Medicare Other | Attending: Emergency Medicine | Admitting: Emergency Medicine

## 2014-05-12 ENCOUNTER — Encounter (HOSPITAL_COMMUNITY): Payer: Self-pay | Admitting: *Deleted

## 2014-05-12 DIAGNOSIS — E119 Type 2 diabetes mellitus without complications: Secondary | ICD-10-CM | POA: Diagnosis not present

## 2014-05-12 DIAGNOSIS — Z87891 Personal history of nicotine dependence: Secondary | ICD-10-CM | POA: Diagnosis not present

## 2014-05-12 DIAGNOSIS — M79601 Pain in right arm: Secondary | ICD-10-CM | POA: Diagnosis present

## 2014-05-12 DIAGNOSIS — Z79899 Other long term (current) drug therapy: Secondary | ICD-10-CM | POA: Diagnosis not present

## 2014-05-12 DIAGNOSIS — Z853 Personal history of malignant neoplasm of breast: Secondary | ICD-10-CM | POA: Insufficient documentation

## 2014-05-12 DIAGNOSIS — M79604 Pain in right leg: Secondary | ICD-10-CM

## 2014-05-12 DIAGNOSIS — I509 Heart failure, unspecified: Secondary | ICD-10-CM | POA: Insufficient documentation

## 2014-05-12 DIAGNOSIS — M1990 Unspecified osteoarthritis, unspecified site: Secondary | ICD-10-CM | POA: Diagnosis not present

## 2014-05-12 DIAGNOSIS — E559 Vitamin D deficiency, unspecified: Secondary | ICD-10-CM | POA: Diagnosis not present

## 2014-05-12 DIAGNOSIS — E039 Hypothyroidism, unspecified: Secondary | ICD-10-CM | POA: Insufficient documentation

## 2014-05-12 DIAGNOSIS — F419 Anxiety disorder, unspecified: Secondary | ICD-10-CM | POA: Diagnosis not present

## 2014-05-12 DIAGNOSIS — I10 Essential (primary) hypertension: Secondary | ICD-10-CM | POA: Diagnosis not present

## 2014-05-12 MED ORDER — ACETAMINOPHEN 325 MG PO TABS
650.0000 mg | ORAL_TABLET | Freq: Once | ORAL | Status: AC
  Administered 2014-05-12: 650 mg via ORAL
  Filled 2014-05-12: qty 2

## 2014-05-12 NOTE — ED Notes (Signed)
[facility] called for pt transport, pt DC's to home

## 2014-05-12 NOTE — Discharge Instructions (Signed)

## 2014-05-12 NOTE — ED Notes (Signed)
Bed: WA09 Expected date:  Expected time:  Means of arrival:  Comments: EMS rt shoulder pain / chronic arthritis

## 2014-05-12 NOTE — ED Provider Notes (Signed)
CSN: [id number]     Arrival date & time 05/12/14  0507 History   First MD Initiated Contact with Patient 05/12/14 0532     Chief Complaint  Patient presents with  . Arm Pain  . Leg Pain     (Consider location/radiation/quality/duration/timing/severity/associated sxs/prior Treatment) HPI Comments: 81 yo female with a history arthritis and shoulder pain who presents with "my arthritis."    Patient is a 81 y.o. female presenting with extremity pain.  Extremity Pain This is a chronic (right shoulder, right leg) problem. Episode onset: months ago. The problem occurs constantly. Progression since onset: worse tonight. Pertinent negatives include no chest pain, no abdominal pain and no shortness of breath. Associated symptoms comments: No injuries. Exacerbated by: movement. Nothing relieves the symptoms. Treatments tried: "everything"    Past Medical History  Diagnosis Date  . Essential hypertension, malignant   . Osteoarthrosis, unspecified whether generalized or localized, unspecified site   . Congestive heart failure, unspecified   . Unspecified vitamin D deficiency   . Unspecified hypothyroidism   . Urinary frequency   . Anxiety   . Diabetes mellitus without complication   . Breast cancer    Past Surgical History  Procedure Laterality Date  . Breast lumpectomy    . Breast lumpectomy    . Cholecystectomy    . Abdominal hysterectomy    . Cataract extraction     No family history on file. History  Substance Use Topics  . Smoking status: Former Research scientist (life sciences)  . Smokeless tobacco: Not on file  . Alcohol Use: No   OB History    No data available     Review of Systems  Respiratory: Negative for shortness of breath.   Cardiovascular: Negative for chest pain.  Gastrointestinal: Negative for abdominal pain.      Allergies  Codeine  Home Medications   Prior to Admission medications   Medication Sig Start Date End Date Taking? Authorizing Provider  ALPRAZolam Duanne Moron) 0.5 MG  tablet Take 0.5 mg by mouth 2 (two) times daily as needed for anxiety.     Historical Provider, MD  carvedilol (COREG) 3.125 MG tablet Take 3.125 mg by mouth 2 (two) times daily with a meal.     Historical Provider, MD  cholecalciferol (VITAMIN D) 1000 UNITS tablet Take 1,000 Units by mouth daily.    Historical Provider, MD  gabapentin (NEURONTIN) 100 MG capsule Take 100 mg by mouth at bedtime.    Historical Provider, MD  hydrOXYzine (ATARAX/VISTARIL) 10 MG tablet Take 10 mg by mouth 3 (three) times daily as needed for itching.    Historical Provider, MD  levothyroxine (SYNTHROID, LEVOTHROID) 88 MCG tablet Take 88 mcg by mouth daily.      Historical Provider, MD  metFORMIN (GLUCOPHAGE) 1000 MG tablet Take 1 tablet (1,000 mg total) by mouth 2 (two) times daily with a meal. 03/16/13   [name], MD  naproxen (NAPROSYN) 375 MG tablet Take 1 tablet (375 mg total) by mouth 2 (two) times daily. Patient taking differently: Take 375 mg by mouth 2 (two) times daily as needed.  06/01/12   Wandra Arthurs, MD  oxyCODONE-acetaminophen (PERCOCET/ROXICET) 5-325 MG per tablet Take 1 tablet by mouth every 6 (six) hours as needed for severe pain. 04/18/14   [name], MD  PARoxetine (PAXIL) 10 MG tablet Take 10 mg by mouth daily.    Historical Provider, MD  silver sulfADIAZINE (SILVADENE) 1 % cream Apply topically daily. 04/18/14   [name], MD  simvastatin (  ZOCOR) 40 MG tablet Take 40 mg by mouth daily. 04/11/14   Historical Provider, MD  spironolactone (ALDACTONE) 25 MG tablet Take 25 mg by mouth daily.      Historical Provider, MD  traMADol (ULTRAM) 50 MG tablet Take 1 tablet (50 mg total) by mouth every 6 (six) hours as needed for pain. 06/01/12   Wandra Arthurs, MD  zolpidem (AMBIEN) 5 MG tablet Take 5 mg by mouth at bedtime as needed for sleep.    Historical Provider, MD   Pulse 50  Temp(Src) 97.5 F (36.4 C) (Oral)  Resp 11  SpO2 100% Physical Exam  Constitutional: She is oriented to person,  place, and time. She appears well-developed and well-nourished. No distress.  HENT:  Head: Normocephalic and atraumatic.  Mouth/Throat: Oropharynx is clear and moist.  Eyes: Conjunctivae are normal. Pupils are equal, round, and reactive to light. No scleral icterus.  Neck: Neck supple.  Cardiovascular: Normal rate, regular rhythm, normal heart sounds and intact distal pulses.   No murmur heard. Pulmonary/Chest: Effort normal and breath sounds normal. No stridor. No respiratory distress. She has no wheezes. She has no rales. She exhibits no tenderness.  Abdominal: Soft. Bowel sounds are normal. She exhibits no distension. There is no tenderness.  Musculoskeletal: Normal range of motion. She exhibits tenderness (right  knee, right hip, right shoulder. Full ROM, but with pain in these joints). She exhibits no edema (2+ distal pulses in all extremities).  Neurological: She is alert and oriented to person, place, and time.  Skin: Skin is warm and dry. No rash noted.  Psychiatric: She has a normal mood and affect. Her behavior is normal.  Nursing note and vitals reviewed.   ED Course  Procedures (including critical care time) Labs Review Labs Reviewed - No data to display  Imaging Review No results found.   EKG Interpretation   Date/Time:  Friday May 12 2014 05:20:00 EST Ventricular Rate:  52 PR Interval:  188 QRS Duration: 143 QT Interval:  482 QTC Calculation: 448 R Axis:   -47 Text Interpretation:  Sinus rhythm Left bundle branch block No significant  change was found Confirmed by [facility]  MD, TREY ([id number]) on 05/12/2014  6:03:07 AM      MDM   Final diagnoses:  Right arm pain  Right leg pain    81 yo female with hx of arthritis who presents with pain in her right shoulder and right hip/knee.  She reports this pain is chronic.  No injuries.  Exam reassuring.  Treated with tylenol and pt subsequently stated she was ready to go home.      Houston Siren III,  MD 05/12/14 [phone]

## 2014-05-12 NOTE — ED Notes (Signed)
Per EMS pt from home, complaining of R side arm and leg pain d/t arthritis, states usually gets shots for her arthritis but has not had shot in about a year and states the cold weather is hurting it also. Pt states she needs pain management

## 2014-11-20 ENCOUNTER — Encounter (HOSPITAL_COMMUNITY): Payer: Self-pay | Admitting: Emergency Medicine

## 2014-11-20 ENCOUNTER — Emergency Department (INDEPENDENT_AMBULATORY_CARE_PROVIDER_SITE_OTHER)
Admission: EM | Admit: 2014-11-20 | Discharge: 2014-11-20 | Disposition: A | Discharge status: Home / Self Care | Payer: Medicare Other | Source: Home / Self Care

## 2014-11-20 DIAGNOSIS — H6123 Impacted cerumen, bilateral: Secondary | ICD-10-CM

## 2014-11-20 DIAGNOSIS — H9113 Presbycusis, bilateral: Secondary | ICD-10-CM

## 2014-11-20 NOTE — ED Provider Notes (Signed)
CSN: [id number]     Arrival date & time 11/20/14  1301 History   First MD Initiated Contact with Patient 11/20/14 1349     Chief Complaint  Patient presents with  . Hearing Problem    The history is provided by the patient.     80 y/o ? Htn, DM ty II, Prior L sided Br Cancer Rx Dr. Ralene Ok 2003, CHF, Hypothyroid, Bipolar, prior TIA, reflux, PSVT She comes in with a complaint of needing her ears cleaned  She tells me that she has no other specific issue She is nonambulatory at baseline and sits in a chair because from right leg weakness - Blurred vision, - double vision, - chest pain, - shortness of breath, - falls, - unilateral weakness  Her daughter says that her feet are swollen more so than usual because last week was her birthday and she will hold bowl of watermelon and has been drinking a lot of water in the heat    Past Medical History  Diagnosis Date  . Essential hypertension, malignant   . Osteoarthrosis, unspecified whether generalized or localized, unspecified site   . Congestive heart failure, unspecified   . Unspecified vitamin D deficiency   . Unspecified hypothyroidism   . Urinary frequency   . Anxiety   . Diabetes mellitus without complication   . Breast cancer    Past Surgical History  Procedure Laterality Date  . Breast lumpectomy    . Breast lumpectomy    . Cholecystectomy    . Abdominal hysterectomy    . Cataract extraction     History reviewed. No pertinent family history. History  Substance Use Topics  . Smoking status: Former Research scientist (life sciences)  . Smokeless tobacco: Not on file  . Alcohol Use: No   OB History    No data available     Review of Systems  Allergies  Codeine  Home Medications   Prior to Admission medications   Medication Sig Start Date End Date Taking? Authorizing Provider  ALPRAZolam Duanne Moron) 0.5 MG tablet Take 0.5 mg by mouth 2 (two) times daily as needed for anxiety.     Historical Provider, MD  carvedilol (COREG) 3.125 MG tablet  Take 3.125 mg by mouth 2 (two) times daily with a meal.     Historical Provider, MD  cholecalciferol (VITAMIN D) 1000 UNITS tablet Take 1,000 Units by mouth daily.    Historical Provider, MD  furosemide (LASIX) 20 MG tablet Take 20 mg by mouth daily.    Historical Provider, MD  gabapentin (NEURONTIN) 100 MG capsule Take 100 mg by mouth at bedtime.    Historical Provider, MD  hydrOXYzine (ATARAX/VISTARIL) 10 MG tablet Take 10 mg by mouth 3 (three) times daily as needed for itching.    Historical Provider, MD  levothyroxine (SYNTHROID, LEVOTHROID) 88 MCG tablet Take 88 mcg by mouth daily.      Historical Provider, MD  metFORMIN (GLUCOPHAGE) 1000 MG tablet Take 1 tablet (1,000 mg total) by mouth 2 (two) times daily with a meal. 03/16/13   [name], MD  naproxen (NAPROSYN) 375 MG tablet Take 1 tablet (375 mg total) by mouth 2 (two) times daily. Patient taking differently: Take 375 mg by mouth 2 (two) times daily as needed.  06/01/12   Wandra Arthurs, MD  oxyCODONE-acetaminophen (PERCOCET/ROXICET) 5-325 MG per tablet Take 1 tablet by mouth every 6 (six) hours as needed for severe pain. Patient not taking: Reported on 05/12/2014 04/18/14   [name], MD  PARoxetine (  PAXIL) 10 MG tablet Take 10 mg by mouth daily.    Historical Provider, MD  silver sulfADIAZINE (SILVADENE) 1 % cream Apply topically daily. 04/18/14   [name], MD  simvastatin (ZOCOR) 40 MG tablet Take 40 mg by mouth daily. 04/11/14   Historical Provider, MD  spironolactone (ALDACTONE) 25 MG tablet Take 25 mg by mouth daily.      Historical Provider, MD  traMADol (ULTRAM) 50 MG tablet Take 1 tablet (50 mg total) by mouth every 6 (six) hours as needed for pain. Patient not taking: Reported on 05/12/2014 06/01/12   Wandra Arthurs, MD   BP 112/66 mmHg  Pulse 67  Temp(Src) 98.7 F (37.1 C) (Other (Comment))  Resp 16  SpO2 98% Physical Exam  EOMI NCAT arcus senilis , face symmetric to smile   poor dentition No JVD Bilateral TMs  are obscured by wax No bruit S1-S2 regular rate rhythm Grade 1 lower extremity edema Power 5/5 bilaterally   ED Course  Procedures (including critical care time) Labs Review Labs Reviewed - No data to display  Imaging Review No results found.   MDM   1. Excess ear wax, bilateral  -The patient has presbycusis likely secondary to impacted earwax  We will have this washed out  2:32 PM   2. Presbycusis of both ears  -After washing of the areas she is able to hear much better and I did give her daughter a written piece of paper with D Brox on it and gave instructions about how to use this        Nita Sells, MD 11/20/14 1457

## 2014-11-20 NOTE — ED Notes (Signed)
Pt has had diminished hearing for the last month and she was told that her ears were impacted.

## 2014-11-20 NOTE — Discharge Instructions (Signed)
Ear Drops You need to put eardrops in your ear. HOME CARE   Put drops in your affected ear as told.  After putting in the drops, lie down with the ear you put the drops in facing up. Stay this way for 10 minutes. Use the ear drops as long as your doctor tells you.  Before you get up, put a cotton ball gently in your ear. Do not push it far in your ear.  Do not wash out your ears unless your doctor says it is okay.  Finish all medicines as told by your doctor. You may be told to keep using the eardrops even if you start to feel better.  See your doctor as told for follow-up visits. GET HELP IF:  You have pain that gets worse.  Any unusual fluid (drainage) is coming from your ear (especially if the fluid stinks).  You have trouble hearing.  You get really dizzy as if the room is spinning and feel sick to your stomach (vertigo).  The outside of your ear becomes red or puffy or both. This may be a sign of an allergic reaction. MAKE SURE YOU:   Understand these instructions.  Will watch your condition.  Will get help right away if you are not doing well or get worse. Document Released: 09/25/2009 Document Revised: 04/12/2013 Document Reviewed: 11/02/2012 [facility] Patient Information 2015 [place], [place]. This information is not intended to replace advice given to you by your health care provider. Make sure you discuss any questions you have with your health care provider.

## 2015-04-24 IMAGING — CR DG TIBIA/FIBULA 2V*R*
4 series · 4 of 4 positions shown · non-contrast
Comparison: Right knee 03/11/2005

CLINICAL DATA: Right leg pain after a fall.

EXAM:
RIGHT TIBIA AND FIBULA - 2 VIEW

[t tib/fib ap right (1 of 2)]
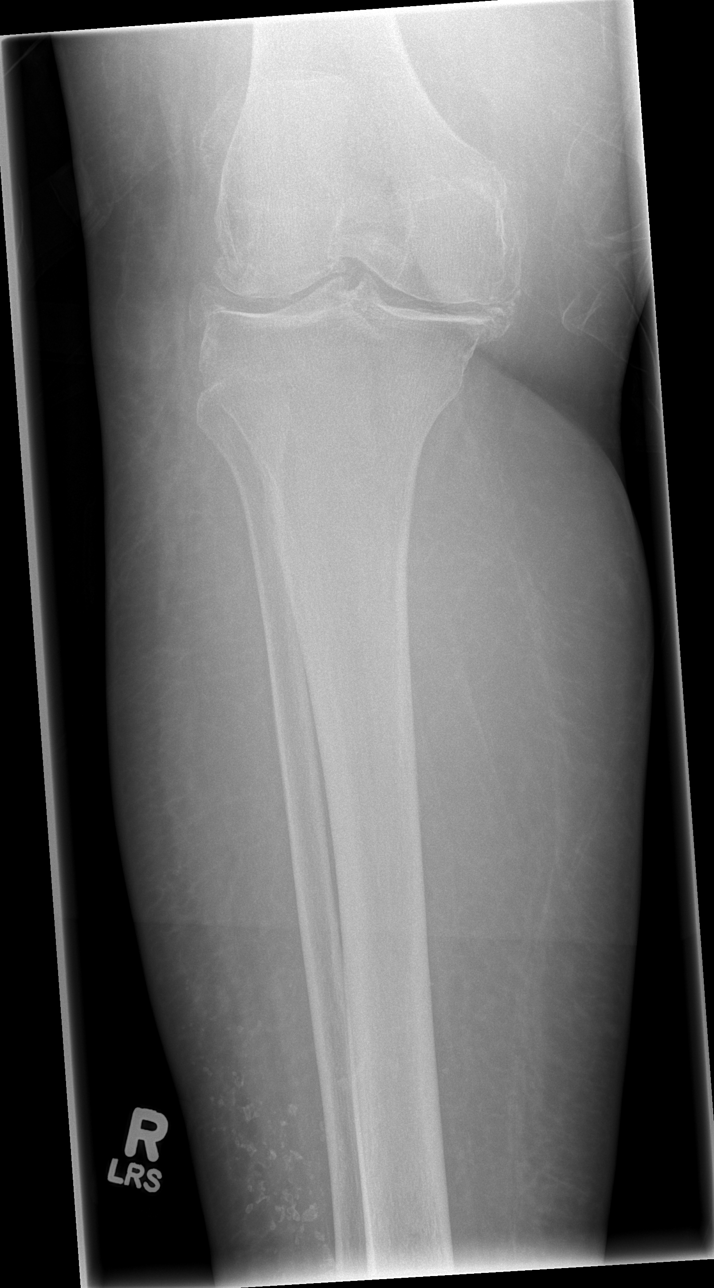

[t tib/fib ap right (2 of 2)]
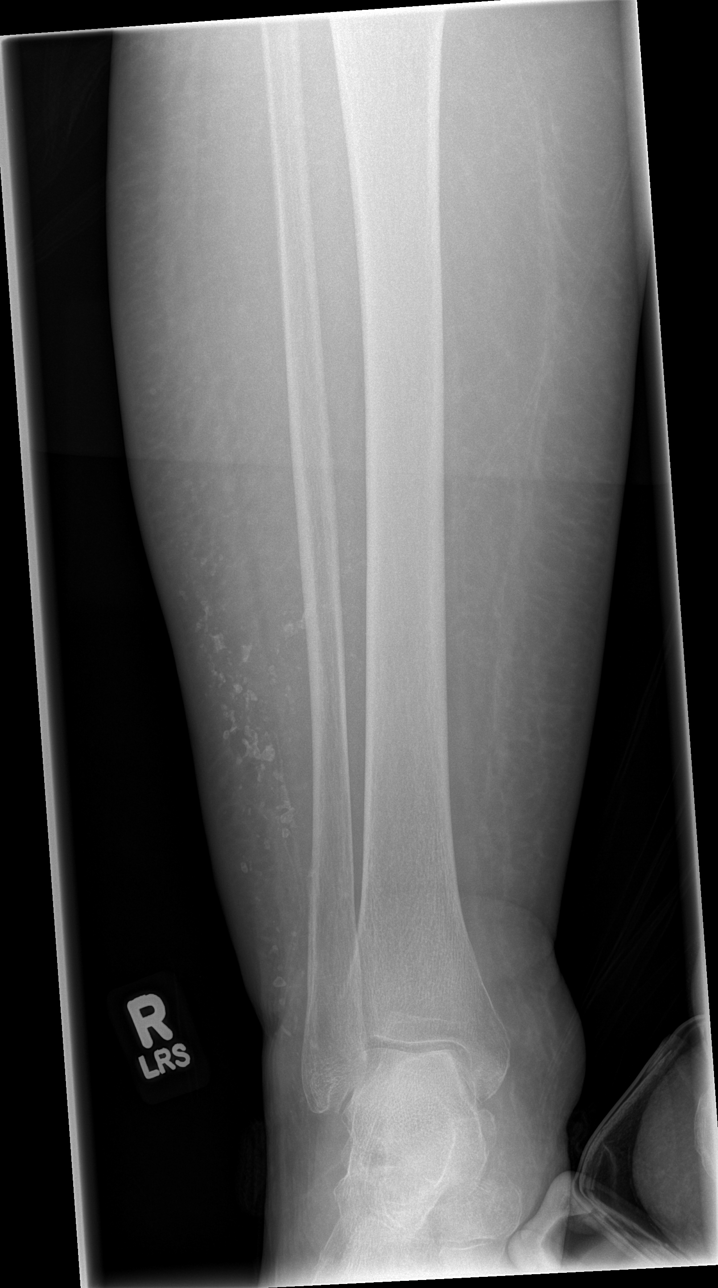

[t tib/fib lat right (1 of 2)]
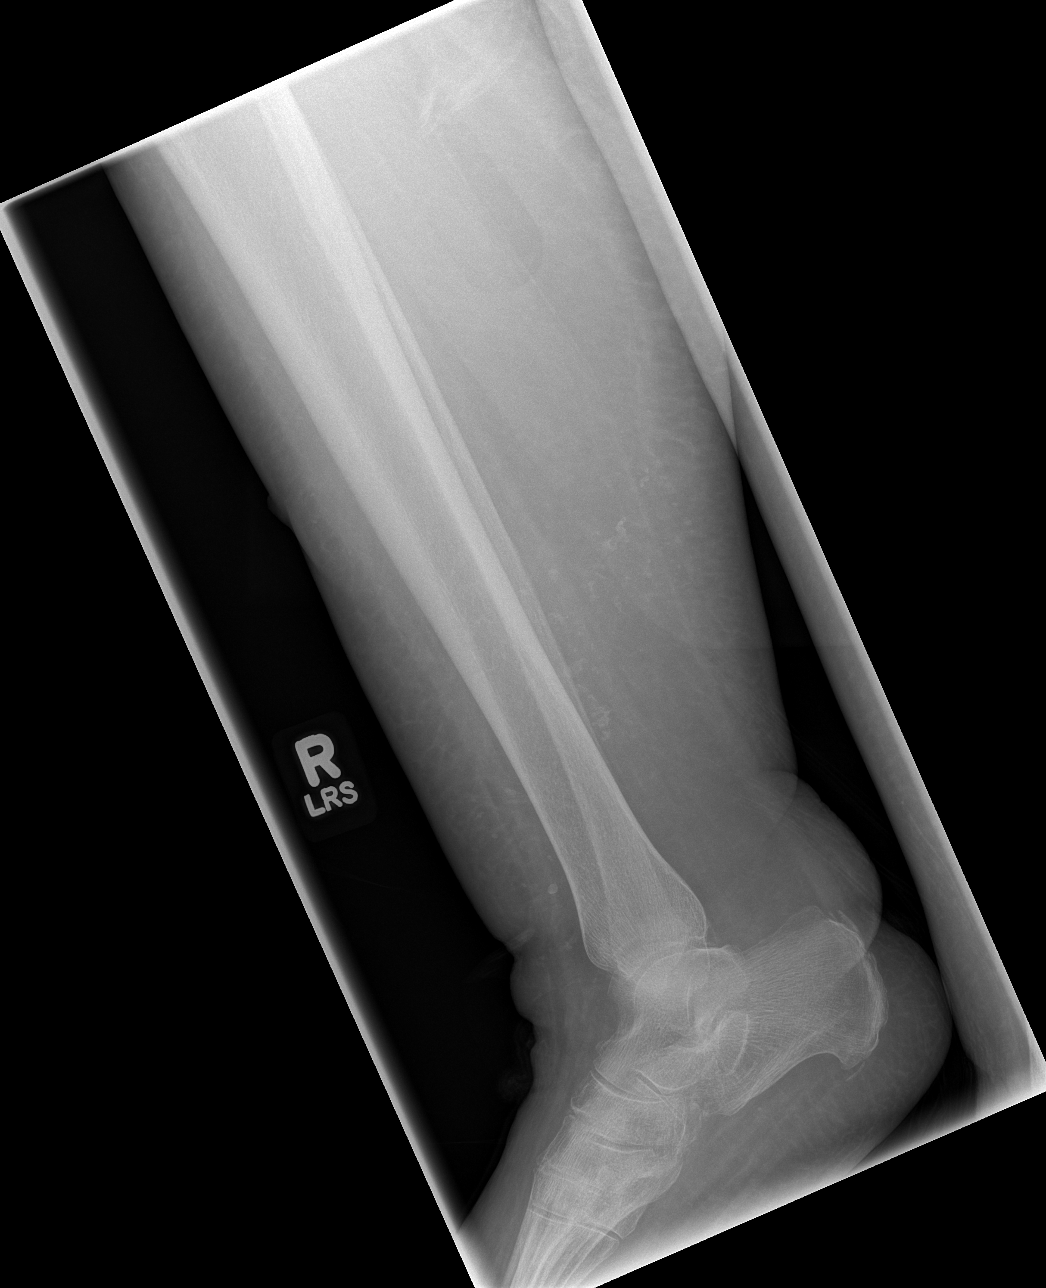

[t tib/fib lat right (2 of 2)]
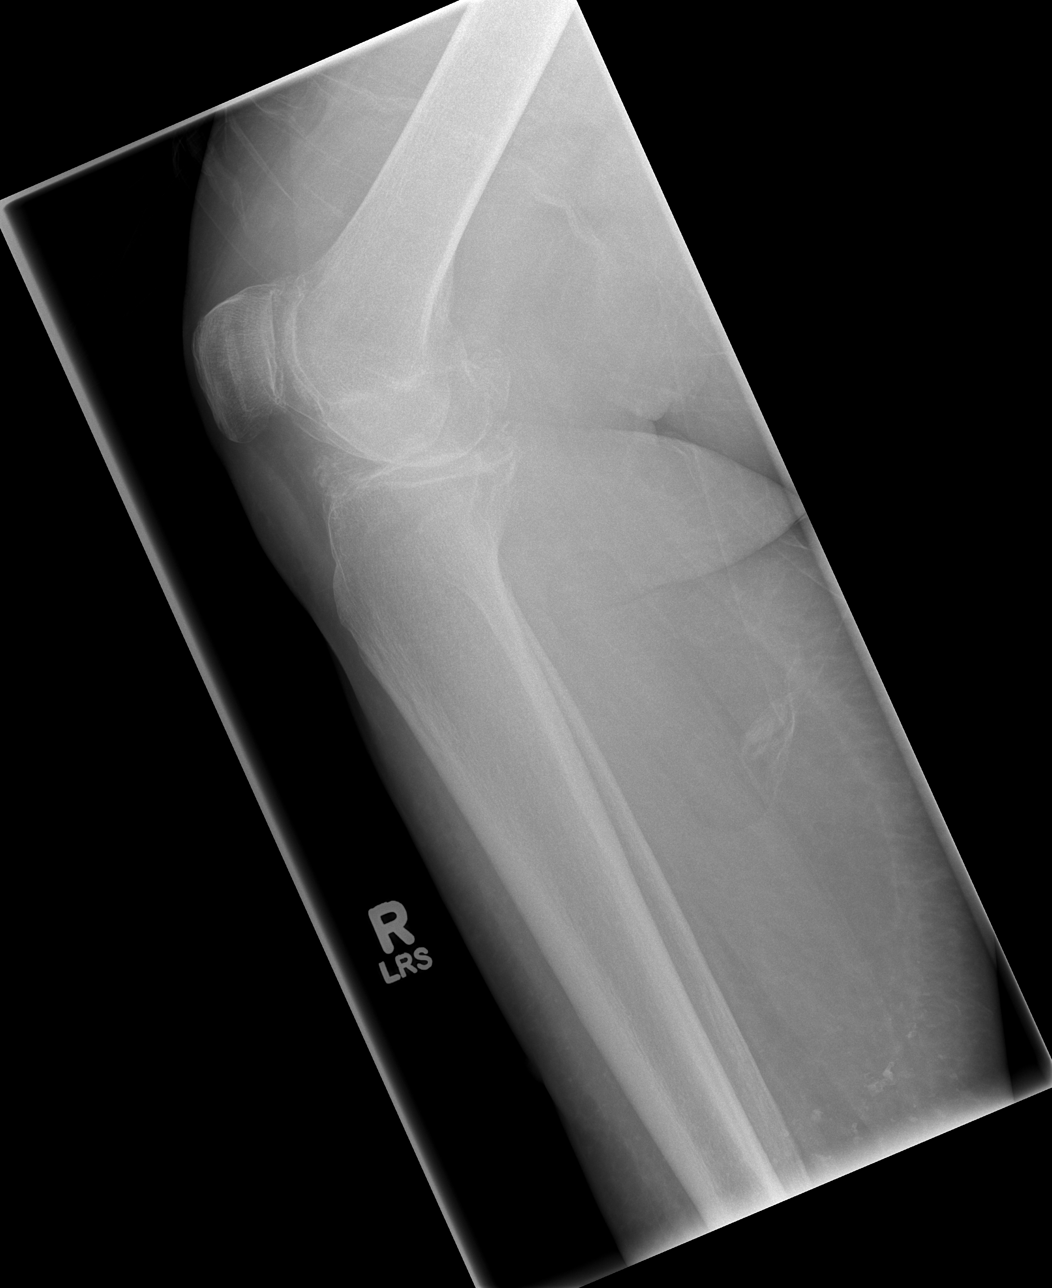

[4 of 4 positions shown; findings below may reference images not displayed]

FINDINGS: Prominent degenerative changes in the right knee with narrowed
medial and lateral compartments and associated hypertrophic changes.
Degenerative changes in the patellofemoral compartment. Joint space
calcifications consistent with chondrocalcinosis. Degenerative
changes have progressed since previous study. No significant
effusion in the right knee. There is no evidence of acute fracture
of the tibia or fibula. Multiple soft tissue calcifications suggest
or matter myositis or vascular calcification. Plantar and Achilles
calcaneal spurs.
IMPRESSION: Degenerative changes of the right knee with progression. No acute
fractures identified.

## 2016-01-06 ENCOUNTER — Encounter (HOSPITAL_COMMUNITY): Payer: Self-pay | Admitting: *Deleted

## 2016-01-06 ENCOUNTER — Emergency Department (HOSPITAL_COMMUNITY)
Admission: EM | Admit: 2016-01-06 | Discharge: 2016-01-07 | Disposition: A | Discharge status: Home / Self Care | Payer: Medicare Other | Attending: Emergency Medicine | Admitting: Emergency Medicine

## 2016-01-06 DIAGNOSIS — M2550 Pain in unspecified joint: Secondary | ICD-10-CM

## 2016-01-06 DIAGNOSIS — E119 Type 2 diabetes mellitus without complications: Secondary | ICD-10-CM | POA: Diagnosis not present

## 2016-01-06 DIAGNOSIS — M25551 Pain in right hip: Secondary | ICD-10-CM | POA: Insufficient documentation

## 2016-01-06 DIAGNOSIS — I509 Heart failure, unspecified: Secondary | ICD-10-CM | POA: Insufficient documentation

## 2016-01-06 DIAGNOSIS — I110 Hypertensive heart disease with heart failure: Secondary | ICD-10-CM | POA: Diagnosis not present

## 2016-01-06 DIAGNOSIS — R531 Weakness: Secondary | ICD-10-CM | POA: Diagnosis present

## 2016-01-06 DIAGNOSIS — Z87891 Personal history of nicotine dependence: Secondary | ICD-10-CM | POA: Diagnosis not present

## 2016-01-06 DIAGNOSIS — Z7984 Long term (current) use of oral hypoglycemic drugs: Secondary | ICD-10-CM | POA: Insufficient documentation

## 2016-01-06 DIAGNOSIS — E039 Hypothyroidism, unspecified: Secondary | ICD-10-CM | POA: Insufficient documentation

## 2016-01-06 DIAGNOSIS — Z79899 Other long term (current) drug therapy: Secondary | ICD-10-CM | POA: Diagnosis not present

## 2016-01-06 DIAGNOSIS — Z853 Personal history of malignant neoplasm of breast: Secondary | ICD-10-CM | POA: Diagnosis not present

## 2016-01-06 MED ORDER — HYDROCODONE-ACETAMINOPHEN 5-325 MG PO TABS
1.0000 | ORAL_TABLET | Freq: Once | ORAL | Status: AC
  Administered 2016-01-07: 1 via ORAL
  Filled 2016-01-06: qty 1

## 2016-01-06 NOTE — ED Triage Notes (Signed)
Per [facility] report: Pt coming from home with weakness.  Pt has not been ambulatory for years.  Pt is in generalized pain.  Pt hx of breat CA.  Left arm restriction.  Pt a/o x 4.

## 2016-01-06 NOTE — ED Notes (Signed)
Bed: [id number] Expected date:  Expected time:  Means of arrival:  Comments: EMS 82yo F gen weakness / pain

## 2016-01-07 DIAGNOSIS — M25551 Pain in right hip: Secondary | ICD-10-CM | POA: Diagnosis not present

## 2016-01-07 MED ORDER — HYDROCODONE-ACETAMINOPHEN 5-325 MG PO TABS: 1.0000 | ORAL_TABLET | Freq: Four times a day (QID) | ORAL | 0 refills | Status: DC | PRN

## 2016-01-07 MED ORDER — DOCUSATE SODIUM 100 MG PO CAPS: 100.0000 mg | ORAL_CAPSULE | Freq: Two times a day (BID) | ORAL | 0 refills | Status: DC

## 2016-01-07 NOTE — ED Provider Notes (Signed)
[facility] DEPT Provider Note   CSN: [id number] Arrival date & time: 01/06/16  2304     History   Chief Complaint Chief Complaint  Patient presents with  . Weakness    HPI [name] is a 82 y.o. female.  HPI The patient has had ongoing problems with pain with osteoarthritis. Tonight she has had pain and more locations. She reports that her hips hurt as well as her knees and hands. Family members report that is not unusual for her to have arthritis pain, but to have it in so many places is more than typical. Patient has not had other associated symptoms. Therefore she has not tried home pain medications. This has been going on for long standing. Past Medical History:  Diagnosis Date  . Anxiety   . Breast cancer ([place])   . Congestive heart failure, unspecified   . Diabetes mellitus without complication ([place])   . Essential hypertension, malignant   . Osteoarthrosis, unspecified whether generalized or localized, unspecified site   . Unspecified hypothyroidism   . Unspecified vitamin D deficiency   . Urinary frequency     Patient Active Problem List   Diagnosis Date Noted  . Fall at home 03/15/2013  . Hip pain, acute 03/15/2013  . Rhabdomyolysis 03/15/2013  . HTN (hypertension) 03/15/2013  . History of CHF (congestive heart failure) 03/15/2013  . Hypothyroidism 03/15/2013  . Breast mass, left 03/15/2013  . DNR no code (do not resuscitate) 03/15/2013    Past Surgical History:  Procedure Laterality Date  . ABDOMINAL HYSTERECTOMY    . BREAST LUMPECTOMY    . BREAST LUMPECTOMY    . CATARACT EXTRACTION    . CHOLECYSTECTOMY      OB History    No data available       Home Medications    Prior to Admission medications   Medication Sig Start Date End Date Taking? Authorizing Provider  ALPRAZolam Duanne Moron) 0.5 MG tablet Take 0.5 mg by mouth 2 (two) times daily as needed for anxiety.    Yes Historical Provider, MD  carvedilol (COREG) 3.125 MG tablet Take 3.125  mg by mouth 2 (two) times daily with a meal.    Yes Historical Provider, MD  cholecalciferol (VITAMIN D) 1000 UNITS tablet Take 1,000 Units by mouth daily.   Yes Historical Provider, MD  furosemide (LASIX) 20 MG tablet Take 20 mg by mouth daily.   Yes Historical Provider, MD  gabapentin (NEURONTIN) 100 MG capsule Take 100 mg by mouth at bedtime.   Yes Historical Provider, MD  hydrOXYzine (ATARAX/VISTARIL) 10 MG tablet Take 10 mg by mouth 3 (three) times daily as needed for itching.   Yes Historical Provider, MD  levothyroxine (SYNTHROID, LEVOTHROID) 88 MCG tablet Take 88 mcg by mouth daily.     Yes Historical Provider, MD  metFORMIN (GLUCOPHAGE) 1000 MG tablet Take 1 tablet (1,000 mg total) by mouth 2 (two) times daily with a meal. 03/16/13  Yes [name], MD  naproxen (NAPROSYN) 375 MG tablet Take 1 tablet (375 mg total) by mouth 2 (two) times daily. Patient taking differently: Take 375 mg by mouth 2 (two) times daily as needed.  06/01/12  Yes [name], MD  PARoxetine (PAXIL) 10 MG tablet Take 10 mg by mouth daily.   Yes Historical Provider, MD  silver sulfADIAZINE (SILVADENE) 1 % cream Apply topically daily. 04/18/14  Yes [name], MD  simvastatin (ZOCOR) 40 MG tablet Take 40 mg by mouth daily. 04/11/14  Yes Historical  Provider, MD  spironolactone (ALDACTONE) 25 MG tablet Take 25 mg by mouth daily.     Yes Historical Provider, MD  docusate sodium (COLACE) 100 MG capsule Take 1 capsule (100 mg total) by mouth every 12 (twelve) hours. 01/07/16   [name], MD  HYDROcodone-acetaminophen (NORCO/VICODIN) 5-325 MG tablet Take 1 tablet by mouth every 6 (six) hours as needed for moderate pain or severe pain. 01/07/16   [name], MD  oxyCODONE-acetaminophen (PERCOCET/ROXICET) 5-325 MG per tablet Take 1 tablet by mouth every 6 (six) hours as needed for severe pain. Patient not taking: Reported on 05/12/2014 04/18/14   [name], MD  traMADol (ULTRAM) 50 MG tablet Take 1 tablet  (50 mg total) by mouth every 6 (six) hours as needed for pain. Patient not taking: Reported on 05/12/2014 06/01/12   [name], MD    Family History No family history on file.  Social History Social History  Substance Use Topics  . Smoking status: Former Research scientist (life sciences)  . Smokeless tobacco: Not on file  . Alcohol use No     Allergies   Codeine and Penicillins   Review of Systems Review of Systems 10 Systems reviewed and are negative for acute change except as noted in the HPI.   Physical Exam Updated Vital Signs BP 95/72 (BP Location: Right Arm)   Pulse (!) 57   Temp 97.4 F (36.3 C) (Oral)   Resp 20   SpO2 100%   Physical Exam  Constitutional: She is oriented to person, place, and time.  Patient is well-appearing for age. She is alert and nontoxic. No respiratory distress.  HENT:  Head: Normocephalic and atraumatic.  Mouth/Throat: Oropharynx is clear and moist.  Eyes: EOM are normal. Pupils are equal, round, and reactive to light.  Neck: Neck supple.  Cardiovascular: Normal rate, regular rhythm, normal heart sounds and intact distal pulses.   Pulmonary/Chest: Effort normal and breath sounds normal.  Abdominal: Soft. She exhibits no distension. There is no tenderness.  Musculoskeletal: Normal range of motion. She exhibits tenderness. She exhibits no edema or deformity.  Patient versus tenderness to range of motion at the right hip. She can however slightly elevate and flex. There is no instability or pain with compression. The knees have arthritis but no erythema or effusions. All joints examined and no effusions present.  Neurological: She is alert and oriented to person, place, and time. No cranial nerve deficit. She exhibits normal muscle tone. Coordination normal.  Skin: Skin is warm and dry.  Psychiatric: She has a normal mood and affect.     ED Treatments / Results  Labs (all labs ordered are listed, but only abnormal results are displayed) Labs Reviewed -  No data to display  EKG  EKG Interpretation None       Radiology No results found.  Procedures Procedures (including critical care time)  Medications Ordered in ED Medications  HYDROcodone-acetaminophen (NORCO/VICODIN) 5-325 MG per tablet 1 tablet (1 tablet Oral Given 01/07/16 0010)     Initial Impression / Assessment and Plan / ED Course  I have reviewed the triage vital signs and the nursing notes.  Pertinent labs & imaging results that were available during my care of the patient were reviewed by me and considered in my medical decision making (see chart for details).  Clinical Course    Final Clinical Impressions(s) / ED Diagnoses   Final diagnoses:  Joint pain  At baseline, the patient is bedridden. She is cared for in her son's home.  She is however alert and interactive. They report that she is complaining of more pain than typical. Patient has not been sick recently. She has not had fevers. Clinically she is well in appearance. At this time it appears she needs pain control. Patient will be given one Vicodin to try every 6 hours. Family members report this has been helpful in the past. Patient has not had any falls or trauma. At baseline she is 24 hours bedridden. Despite this, her general physical condition is fairly good with a bright mental status.  New Prescriptions New Prescriptions   DOCUSATE SODIUM (COLACE) 100 MG CAPSULE    Take 1 capsule (100 mg total) by mouth every 12 (twelve) hours.   HYDROCODONE-ACETAMINOPHEN (NORCO/VICODIN) 5-325 MG TABLET    Take 1 tablet by mouth every 6 (six) hours as needed for moderate pain or severe pain.     [name], MD 01/07/16 [phone]

## 2016-01-07 NOTE — ED Notes (Signed)
[facility] called for transport.  

## 2016-05-21 ENCOUNTER — Emergency Department (HOSPITAL_COMMUNITY)
Admission: EM | Admit: 2016-05-21 | Discharge: 2016-05-21 | Disposition: A | Discharge status: Home / Self Care | Payer: Medicare Other | Attending: Emergency Medicine | Admitting: Emergency Medicine

## 2016-05-21 ENCOUNTER — Encounter (HOSPITAL_COMMUNITY): Payer: Self-pay

## 2016-05-21 DIAGNOSIS — Z79899 Other long term (current) drug therapy: Secondary | ICD-10-CM | POA: Insufficient documentation

## 2016-05-21 DIAGNOSIS — L89159 Pressure ulcer of sacral region, unspecified stage: Secondary | ICD-10-CM | POA: Insufficient documentation

## 2016-05-21 DIAGNOSIS — Z853 Personal history of malignant neoplasm of breast: Secondary | ICD-10-CM | POA: Diagnosis not present

## 2016-05-21 DIAGNOSIS — M791 Myalgia: Secondary | ICD-10-CM | POA: Diagnosis present

## 2016-05-21 DIAGNOSIS — I509 Heart failure, unspecified: Secondary | ICD-10-CM | POA: Diagnosis not present

## 2016-05-21 DIAGNOSIS — Z87891 Personal history of nicotine dependence: Secondary | ICD-10-CM | POA: Insufficient documentation

## 2016-05-21 DIAGNOSIS — L98499 Non-pressure chronic ulcer of skin of other sites with unspecified severity: Secondary | ICD-10-CM | POA: Insufficient documentation

## 2016-05-21 DIAGNOSIS — E039 Hypothyroidism, unspecified: Secondary | ICD-10-CM | POA: Diagnosis not present

## 2016-05-21 DIAGNOSIS — I110 Hypertensive heart disease with heart failure: Secondary | ICD-10-CM | POA: Insufficient documentation

## 2016-05-21 DIAGNOSIS — Z7984 Long term (current) use of oral hypoglycemic drugs: Secondary | ICD-10-CM | POA: Insufficient documentation

## 2016-05-21 DIAGNOSIS — E119 Type 2 diabetes mellitus without complications: Secondary | ICD-10-CM | POA: Insufficient documentation

## 2016-05-21 DIAGNOSIS — L089 Local infection of the skin and subcutaneous tissue, unspecified: Secondary | ICD-10-CM

## 2016-05-21 LAB — BASIC METABOLIC PANEL
ANION GAP: 12 (ref 5–15)
BUN: 16 mg/dL (ref 6–20)
CHLORIDE: 103 mmol/L (ref 101–111)
CO2: 25 mmol/L (ref 22–32)
Calcium: 9.3 mg/dL (ref 8.9–10.3)
Creatinine, Ser: 0.65 mg/dL (ref 0.44–1.00)
GFR calc Af Amer: >60 mL/min (ref >60)
GFR calc non Af Amer: >60 mL/min (ref >60)
GLUCOSE: 101 mg/dL — AB (ref 65–99)
POTASSIUM: 4 mmol/L (ref 3.5–5.1)
Sodium: 140 mmol/L (ref 135–145)

## 2016-05-21 LAB — CBC WITH DIFFERENTIAL/PLATELET
Basophils Absolute: 0 10*3/uL (ref 0.0–0.1)
Basophils Relative: 0 %
Eosinophils Absolute: 0.1 10*3/uL (ref 0.0–0.7)
Eosinophils Relative: 0 %
HCT: 41.9 % (ref 36.0–46.0)
HEMOGLOBIN: 13.1 g/dL (ref 12.0–15.0)
Lymphocytes Relative: 14 %
Lymphs Abs: 1.8 10*3/uL (ref 0.7–4.0)
MCH: 30.3 pg (ref 26.0–34.0)
MCHC: 31.3 g/dL (ref 30.0–36.0)
MCV: 97 fL (ref 78.0–100.0)
MONO ABS: 0.8 10*3/uL (ref 0.1–1.0)
Monocytes Relative: 7 %
Neutro Abs: 9.8 10*3/uL — ABNORMAL HIGH (ref 1.7–7.7)
Neutrophils Relative %: 79 %
Platelets: 312 10*3/uL (ref 150–400)
RBC: 4.32 MIL/uL (ref 3.87–5.11)
RDW: 14.1 % (ref 11.5–15.5)
WBC: 12.4 10*3/uL — ABNORMAL HIGH (ref 4.0–10.5)

## 2016-05-21 MED ORDER — CIPROFLOXACIN HCL 500 MG PO TABS: 500.0000 mg | ORAL_TABLET | Freq: Two times a day (BID) | ORAL | 0 refills | Status: DC

## 2016-05-21 MED ORDER — TRAMADOL HCL 50 MG PO TABS: 50.0000 mg | ORAL_TABLET | Freq: Two times a day (BID) | ORAL | 0 refills | Status: DC | PRN

## 2016-05-21 MED ORDER — DOXYCYCLINE HYCLATE 100 MG PO CAPS: 100.0000 mg | ORAL_CAPSULE | Freq: Two times a day (BID) | ORAL | 0 refills | Status: DC

## 2016-05-21 MED ORDER — DOXYCYCLINE HYCLATE 100 MG PO TABS
100.0000 mg | ORAL_TABLET | Freq: Once | ORAL | Status: AC
  Administered 2016-05-21: 100 mg via ORAL
  Filled 2016-05-21: qty 1

## 2016-05-21 MED ORDER — TRAMADOL HCL 50 MG PO TABS
50.0000 mg | ORAL_TABLET | Freq: Once | ORAL | Status: AC
  Administered 2016-05-21: 50 mg via ORAL
  Filled 2016-05-21: qty 1

## 2016-05-21 NOTE — ED Notes (Signed)
Patient came in with deep pressure ulcer on the buttock. With few more shallow site pressure ulcer on both buttock.

## 2016-05-21 NOTE — ED Triage Notes (Signed)
Patient came by EMS from home with pain to the buttock, pressure ulcer to the buttock and left side pain with edema last 2 weeks. Patient lives at home by her self . Pt has not been unable to eat or drink well.  EMS vitals-BP 98/60, HR 80, RR 16, 96 % RA and CBG 110.

## 2016-05-21 NOTE — ED Notes (Signed)
Bed: [facility] Expected date:  Expected time:  Means of arrival:  Comments: 

## 2016-05-21 NOTE — Discharge Instructions (Signed)
Please reach out to your primary care provider or follow up with [facility] for further management of your sacral decubitus ulcer.  Take tylenol as needed for pain. When pain is severe, you may take tramadol for pain.  Take antibiotic as prescribed for wound infection.

## 2016-05-21 NOTE — ED Notes (Signed)
Bed: [id number] Expected date:  Expected time:  Means of arrival:  Comments: 83 yo F pressure ulcer

## 2016-05-21 NOTE — ED Provider Notes (Signed)
[facility] DEPT Provider Note   CSN: [id number] Arrival date & time: 05/21/16  1707     History   Chief Complaint No chief complaint on file.   HPI [name] is a 83 y.o. female.  HPI   83 year old female with history of diabetes, hypertension, CHF, thyroid disease brought here via EMS from home for pain in buttock. History obtained through daughter who is at bedside. Per daughter, patient has had a pressure ulcer for more than a year however for the past several months it has becoming more painful to her. She attributed the worsening condition due to patient not eating much and have been laying in bed much more than usual. Daughter recently started giving patient Tylenol for the past several days which did not provide adequate relief. Family member did reach out to patient's PCP about the wound several days prior. Her doctor will going to send a wound care nurse out to the house but family member has not seen anyone yet and decided to come to ER today for further management. Patient did have wound care treatment several month prior for about 2 weeks for the same wound. No report of any new sickness or any recent fall. Patient's primary complaint is pain to her pressure ulcer.  Past Medical History:  Diagnosis Date  . Anxiety   . Breast cancer ([place])   . Congestive heart failure, unspecified ([place])   . Diabetes mellitus without complication ([place])   . Essential hypertension, malignant   . Osteoarthrosis, unspecified whether generalized or localized, unspecified site   . Unspecified hypothyroidism   . Unspecified vitamin D deficiency   . Urinary frequency     Patient Active Problem List   Diagnosis Date Noted  . Fall at home 03/15/2013  . Hip pain, acute 03/15/2013  . Rhabdomyolysis 03/15/2013  . HTN (hypertension) 03/15/2013  . History of CHF (congestive heart failure) 03/15/2013  . Hypothyroidism 03/15/2013  . Breast mass, left 03/15/2013  . DNR no code (do not  resuscitate) 03/15/2013    Past Surgical History:  Procedure Laterality Date  . ABDOMINAL HYSTERECTOMY    . BREAST LUMPECTOMY    . BREAST LUMPECTOMY    . CATARACT EXTRACTION    . CHOLECYSTECTOMY      OB History    No data available       Home Medications    Prior to Admission medications   Medication Sig Start Date End Date Taking? Authorizing Provider  ALPRAZolam Duanne Moron) 0.5 MG tablet Take 0.5 mg by mouth 2 (two) times daily as needed for anxiety.     Historical Provider, MD  carvedilol (COREG) 3.125 MG tablet Take 3.125 mg by mouth 2 (two) times daily with a meal.     Historical Provider, MD  cholecalciferol (VITAMIN D) 1000 UNITS tablet Take 1,000 Units by mouth daily.    Historical Provider, MD  docusate sodium (COLACE) 100 MG capsule Take 1 capsule (100 mg total) by mouth every 12 (twelve) hours. 01/07/16   [name], MD  furosemide (LASIX) 20 MG tablet Take 20 mg by mouth daily.    Historical Provider, MD  gabapentin (NEURONTIN) 100 MG capsule Take 100 mg by mouth at bedtime.    Historical Provider, MD  HYDROcodone-acetaminophen (NORCO/VICODIN) 5-325 MG tablet Take 1 tablet by mouth every 6 (six) hours as needed for moderate pain or severe pain. 01/07/16   [name], MD  hydrOXYzine (ATARAX/VISTARIL) 10 MG tablet Take 10 mg by mouth 3 (three) times daily as  needed for itching.    Historical Provider, MD  levothyroxine (SYNTHROID, LEVOTHROID) 88 MCG tablet Take 88 mcg by mouth daily.      Historical Provider, MD  metFORMIN (GLUCOPHAGE) 1000 MG tablet Take 1 tablet (1,000 mg total) by mouth 2 (two) times daily with a meal. 03/16/13   [name], MD  naproxen (NAPROSYN) 375 MG tablet Take 1 tablet (375 mg total) by mouth 2 (two) times daily. Patient taking differently: Take 375 mg by mouth 2 (two) times daily as needed.  06/01/12   [name], MD  oxyCODONE-acetaminophen (PERCOCET/ROXICET) 5-325 MG per tablet Take 1 tablet by mouth every 6 (six) hours as  needed for severe pain. Patient not taking: Reported on 05/12/2014 04/18/14   [name], MD  PARoxetine (PAXIL) 10 MG tablet Take 10 mg by mouth daily.    Historical Provider, MD  silver sulfADIAZINE (SILVADENE) 1 % cream Apply topically daily. 04/18/14   [name], MD  simvastatin (ZOCOR) 40 MG tablet Take 40 mg by mouth daily. 04/11/14   Historical Provider, MD  spironolactone (ALDACTONE) 25 MG tablet Take 25 mg by mouth daily.      Historical Provider, MD  traMADol (ULTRAM) 50 MG tablet Take 1 tablet (50 mg total) by mouth every 6 (six) hours as needed for pain. Patient not taking: Reported on 05/12/2014 06/01/12   [name], MD    Family History No family history on file.  Social History Social History  Substance Use Topics  . Smoking status: Former Research scientist (life sciences)  . Smokeless tobacco: Not on file  . Alcohol use No     Allergies   Codeine and Penicillins   Review of Systems Review of Systems  All other systems reviewed and are negative.    Physical Exam Updated Vital Signs BP 112/95   Pulse 96   Temp 98.9 F (37.2 C) (Axillary)   Resp 15   SpO2 99%   Physical Exam  Constitutional: She appears well-developed and well-nourished. No distress.  Elderly female laying in bed in a right lateral decubitus position appears uncomfortable.  HENT:  Head: Atraumatic.  Eyes: Conjunctivae are normal.  Neck: Neck supple.  Cardiovascular: Normal rate and regular rhythm.   Pulmonary/Chest: Effort normal and breath sounds normal.  Neurological: She is alert.  Skin: No rash noted.  Chaperone present during exam. Patient had a 5 cm pressure ulcer to her sacral decubitus lesion, showing tunnelling likely a stage III with macerated skin changes and odor.  TTP several small superficial ulcerations noted to L buttock.    Psychiatric: She has a normal mood and affect.  Nursing note and vitals reviewed.    ED Treatments / Results  Labs (all labs ordered are listed, but only  abnormal results are displayed) Labs Reviewed  CBC WITH DIFFERENTIAL/PLATELET - Abnormal; Notable for the following:       Result Value   WBC 12.4 (*)    Neutro Abs 9.8 (*)    All other components within normal limits  BASIC METABOLIC PANEL - Abnormal; Notable for the following:    Glucose, Bld 101 (*)    All other components within normal limits    EKG  EKG Interpretation None       Radiology No results found.  Procedures Procedures (including critical care time)  Medications Ordered in ED Medications - No data to display   Initial Impression / Assessment and Plan / ED Course  I have reviewed the triage vital signs and the  nursing notes.  Pertinent labs & imaging results that were available during my care of the patient were reviewed by me and considered in my medical decision making (see chart for details).     BP 112/95   Pulse 96   Temp 98.9 F (37.2 C) (Axillary)   Resp 15   Ht 5\' 2"  (1.575 m)   Wt 54.4 kg   SpO2 99%   BMI 21.95 kg/m    Final Clinical Impressions(s) / ED Diagnoses   Final diagnoses:  Decubitus ulcer of sacral region, unspecified ulcer stage    New Prescriptions New Prescriptions   DOXYCYCLINE (VIBRAMYCIN) 100 MG CAPSULE    Take 1 capsule (100 mg total) by mouth 2 (two) times daily. One po bid x 7 days   6:14 PM Patient has a pressure ulcer to her sacral decubitus lesion. This has been an ongoing issue for more than a year but  worsen within the past 3 months. This is likely due to failure to thrive as patient doesn't eat much, and has been losing weight. Pressure ulcer is tender, and appears infected. She will benefit from prolonged wound care management, which can be set up through her primary care provider. She will also benefit from pain medication and antibiotics in the meantime. She is currently afebrile with stable vital sign. Care discussed to Dr. Ellender Hose.  7:10 PM Pt's wound has been dressed by our nurse using wet to dry  dressing.  Basic labs obtained to help with trending.  Pt was given tramadol for pain and doxycycline and cipro for management of wound infection.  Pt would benefit from IV antibiotic (vanc/zosyn) and admission.  We did offer admission for her wound infection but pt declined. Pt understand she can return if she has any concerns.        Domenic Moras, PA-C 05/21/16 2008    Duffy Bruce, MD 05/23/16 [phone]

## 2016-05-21 NOTE — ED Notes (Signed)
Bed: [id number] Expected date:  Expected time:  Means of arrival:  Comments: 9m fever x 1 hour

## 2016-06-17 ENCOUNTER — Emergency Department (HOSPITAL_COMMUNITY): Payer: Medicare Other

## 2016-06-17 ENCOUNTER — Encounter (HOSPITAL_COMMUNITY): Payer: Self-pay | Admitting: Internal Medicine

## 2016-06-17 ENCOUNTER — Inpatient Hospital Stay (HOSPITAL_COMMUNITY)
Admission: EM | Admit: 2016-06-17 | Discharge: 2016-06-20 | DRG: 871 | Disposition: A | Discharge status: Hospice | Payer: Medicare Other | Attending: Internal Medicine | Admitting: Internal Medicine

## 2016-06-17 DIAGNOSIS — L03312 Cellulitis of back [any part except buttock]: Secondary | ICD-10-CM | POA: Diagnosis present

## 2016-06-17 DIAGNOSIS — E039 Hypothyroidism, unspecified: Secondary | ICD-10-CM | POA: Diagnosis present

## 2016-06-17 DIAGNOSIS — Z79899 Other long term (current) drug therapy: Secondary | ICD-10-CM

## 2016-06-17 DIAGNOSIS — Z515 Encounter for palliative care: Secondary | ICD-10-CM

## 2016-06-17 DIAGNOSIS — Z853 Personal history of malignant neoplasm of breast: Secondary | ICD-10-CM

## 2016-06-17 DIAGNOSIS — I5030 Unspecified diastolic (congestive) heart failure: Secondary | ICD-10-CM | POA: Diagnosis present

## 2016-06-17 DIAGNOSIS — G92 Toxic encephalopathy: Secondary | ICD-10-CM | POA: Diagnosis present

## 2016-06-17 DIAGNOSIS — E860 Dehydration: Secondary | ICD-10-CM | POA: Diagnosis present

## 2016-06-17 DIAGNOSIS — L8990 Pressure ulcer of unspecified site, unspecified stage: Secondary | ICD-10-CM | POA: Diagnosis present

## 2016-06-17 DIAGNOSIS — R6883 Chills (without fever): Secondary | ICD-10-CM | POA: Diagnosis present

## 2016-06-17 DIAGNOSIS — L89150 Pressure ulcer of sacral region, unstageable: Secondary | ICD-10-CM | POA: Diagnosis not present

## 2016-06-17 DIAGNOSIS — Z66 Do not resuscitate: Secondary | ICD-10-CM | POA: Diagnosis present

## 2016-06-17 DIAGNOSIS — I959 Hypotension, unspecified: Secondary | ICD-10-CM | POA: Diagnosis present

## 2016-06-17 DIAGNOSIS — E119 Type 2 diabetes mellitus without complications: Secondary | ICD-10-CM | POA: Diagnosis present

## 2016-06-17 DIAGNOSIS — G9340 Encephalopathy, unspecified: Secondary | ICD-10-CM | POA: Diagnosis present

## 2016-06-17 DIAGNOSIS — I110 Hypertensive heart disease with heart failure: Secondary | ICD-10-CM | POA: Diagnosis present

## 2016-06-17 DIAGNOSIS — T402X5A Adverse effect of other opioids, initial encounter: Secondary | ICD-10-CM | POA: Diagnosis not present

## 2016-06-17 DIAGNOSIS — Z8679 Personal history of other diseases of the circulatory system: Secondary | ICD-10-CM | POA: Diagnosis not present

## 2016-06-17 DIAGNOSIS — A419 Sepsis, unspecified organism: Secondary | ICD-10-CM | POA: Diagnosis present

## 2016-06-17 DIAGNOSIS — I952 Hypotension due to drugs: Secondary | ICD-10-CM | POA: Diagnosis not present

## 2016-06-17 DIAGNOSIS — Z87891 Personal history of nicotine dependence: Secondary | ICD-10-CM

## 2016-06-17 DIAGNOSIS — R531 Weakness: Secondary | ICD-10-CM | POA: Diagnosis present

## 2016-06-17 DIAGNOSIS — Z7401 Bed confinement status: Secondary | ICD-10-CM | POA: Diagnosis not present

## 2016-06-17 DIAGNOSIS — Z90710 Acquired absence of both cervix and uterus: Secondary | ICD-10-CM

## 2016-06-17 DIAGNOSIS — L0390 Cellulitis, unspecified: Secondary | ICD-10-CM | POA: Diagnosis present

## 2016-06-17 DIAGNOSIS — Y92230 Patient room in hospital as the place of occurrence of the external cause: Secondary | ICD-10-CM | POA: Diagnosis not present

## 2016-06-17 DIAGNOSIS — R627 Adult failure to thrive: Secondary | ICD-10-CM | POA: Diagnosis present

## 2016-06-17 DIAGNOSIS — I950 Idiopathic hypotension: Secondary | ICD-10-CM

## 2016-06-17 DIAGNOSIS — Z7189 Other specified counseling: Secondary | ICD-10-CM | POA: Diagnosis not present

## 2016-06-17 DIAGNOSIS — I10 Essential (primary) hypertension: Secondary | ICD-10-CM | POA: Diagnosis present

## 2016-06-17 DIAGNOSIS — E559 Vitamin D deficiency, unspecified: Secondary | ICD-10-CM | POA: Diagnosis present

## 2016-06-17 DIAGNOSIS — M549 Dorsalgia, unspecified: Secondary | ICD-10-CM | POA: Diagnosis present

## 2016-06-17 DIAGNOSIS — Z880 Allergy status to penicillin: Secondary | ICD-10-CM

## 2016-06-17 DIAGNOSIS — Z885 Allergy status to narcotic agent status: Secondary | ICD-10-CM

## 2016-06-17 DIAGNOSIS — Z9049 Acquired absence of other specified parts of digestive tract: Secondary | ICD-10-CM

## 2016-06-17 DIAGNOSIS — F419 Anxiety disorder, unspecified: Secondary | ICD-10-CM | POA: Diagnosis present

## 2016-06-17 DIAGNOSIS — Z9849 Cataract extraction status, unspecified eye: Secondary | ICD-10-CM | POA: Diagnosis not present

## 2016-06-17 DIAGNOSIS — L89154 Pressure ulcer of sacral region, stage 4: Secondary | ICD-10-CM | POA: Diagnosis present

## 2016-06-17 DIAGNOSIS — R6510 Systemic inflammatory response syndrome (SIRS) of non-infectious origin without acute organ dysfunction: Secondary | ICD-10-CM | POA: Diagnosis not present

## 2016-06-17 DIAGNOSIS — Z7984 Long term (current) use of oral hypoglycemic drugs: Secondary | ICD-10-CM

## 2016-06-17 HISTORY — DX: Pressure ulcer of unspecified site, unspecified stage: L89.90

## 2016-06-17 LAB — LACTIC ACID, PLASMA
LACTIC ACID, VENOUS: 2 mmol/L — AB (ref 0.5–1.9)
LACTIC ACID, VENOUS: 2.1 mmol/L — AB (ref 0.5–1.9)
Lactic Acid, Venous: 2 mmol/L (ref 0.5–1.9)
Lactic Acid, Venous: 2.9 mmol/L (ref 0.5–1.9)

## 2016-06-17 LAB — CBC WITH DIFFERENTIAL/PLATELET
BASOS PCT: 0 %
Basophils Absolute: 0 10*3/uL (ref 0.0–0.1)
EOS ABS: 0 10*3/uL (ref 0.0–0.7)
EOS PCT: 0 %
HCT: 39.2 % (ref 36.0–46.0)
Hemoglobin: 12.6 g/dL (ref 12.0–15.0)
LYMPHS PCT: 7 %
Lymphs Abs: 0.8 10*3/uL (ref 0.7–4.0)
MCH: 30.9 pg (ref 26.0–34.0)
MCHC: 32.1 g/dL (ref 30.0–36.0)
MCV: 96.1 fL (ref 78.0–100.0)
Monocytes Absolute: 0.7 10*3/uL (ref 0.1–1.0)
Monocytes Relative: 7 %
Neutro Abs: 9.3 10*3/uL — ABNORMAL HIGH (ref 1.7–7.7)
Neutrophils Relative %: 86 %
PLATELETS: 321 10*3/uL (ref 150–400)
RBC: 4.08 MIL/uL (ref 3.87–5.11)
RDW: 16.1 % — ABNORMAL HIGH (ref 11.5–15.5)
WBC: 10.8 10*3/uL — AB (ref 4.0–10.5)

## 2016-06-17 LAB — COMPREHENSIVE METABOLIC PANEL
ALBUMIN: 2.1 g/dL — AB (ref 3.5–5.0)
ALT: 30 U/L (ref 14–54)
AST: 37 U/L (ref 15–41)
Alkaline Phosphatase: 136 U/L — ABNORMAL HIGH (ref 38–126)
Anion gap: 14 (ref 5–15)
BUN: 17 mg/dL (ref 6–20)
CHLORIDE: 102 mmol/L (ref 101–111)
CO2: 23 mmol/L (ref 22–32)
CREATININE: 0.67 mg/dL (ref 0.44–1.00)
Calcium: 9.2 mg/dL (ref 8.9–10.3)
GFR calc Af Amer: >60 mL/min (ref >60)
GFR calc non Af Amer: >60 mL/min (ref >60)
GLUCOSE: 88 mg/dL (ref 65–99)
POTASSIUM: 3.6 mmol/L (ref 3.5–5.1)
SODIUM: 139 mmol/L (ref 135–145)
Total Bilirubin: 1.4 mg/dL — ABNORMAL HIGH (ref 0.3–1.2)
Total Protein: 5.7 g/dL — ABNORMAL LOW (ref 6.5–8.1)

## 2016-06-17 LAB — PROTIME-INR
INR: 1.15
PROTHROMBIN TIME: 14.8 s (ref 11.4–15.2)

## 2016-06-17 LAB — VITAMIN B12: Vitamin B-12: 723 pg/mL (ref 180–914)

## 2016-06-17 LAB — PROCALCITONIN: Procalcitonin: 0.17 ng/mL

## 2016-06-17 LAB — TROPONIN I: Troponin I: 0.07 ng/mL (ref <0.03)

## 2016-06-17 LAB — APTT: APTT: 30 s (ref 24–36)

## 2016-06-17 MED ORDER — PIPERACILLIN-TAZOBACTAM 3.375 G IVPB 30 MIN
3.3750 g | Freq: Once | INTRAVENOUS | Status: AC
  Administered 2016-06-17: 3.375 g via INTRAVENOUS
  Filled 2016-06-17: qty 50

## 2016-06-17 MED ORDER — SPIRONOLACTONE 25 MG PO TABS: 25.0000 mg | ORAL_TABLET | Freq: Every day | ORAL | Status: DC

## 2016-06-17 MED ORDER — ENSURE ENLIVE PO LIQD: 237.0000 mL | Freq: Two times a day (BID) | ORAL | Status: DC

## 2016-06-17 MED ORDER — ACETAMINOPHEN 325 MG PO TABS: 650.0000 mg | ORAL_TABLET | Freq: Four times a day (QID) | ORAL | Status: DC | PRN

## 2016-06-17 MED ORDER — VANCOMYCIN HCL IN DEXTROSE 1-5 GM/200ML-% IV SOLN
1000.0000 mg | Freq: Once | INTRAVENOUS | Status: AC
  Administered 2016-06-17: 1000 mg via INTRAVENOUS
  Filled 2016-06-17: qty 200

## 2016-06-17 MED ORDER — SODIUM CHLORIDE 0.9 % IV BOLUS (SEPSIS)
250.0000 mL | Freq: Once | INTRAVENOUS | Status: AC
  Administered 2016-06-17: 250 mL via INTRAVENOUS

## 2016-06-17 MED ORDER — MORPHINE SULFATE (PF) 4 MG/ML IV SOLN
4.0000 mg | Freq: Once | INTRAVENOUS | Status: AC
  Administered 2016-06-17: 4 mg via INTRAVENOUS
  Filled 2016-06-17: qty 1

## 2016-06-17 MED ORDER — ALPRAZOLAM 0.5 MG PO TABS
0.5000 mg | ORAL_TABLET | Freq: Two times a day (BID) | ORAL | Status: DC | PRN
Start: 2016-06-17 — End: 2016-06-17

## 2016-06-17 MED ORDER — VANCOMYCIN HCL IN DEXTROSE 1-5 GM/200ML-% IV SOLN
1000.0000 mg | INTRAVENOUS | Status: DC
  Administered 2016-06-19: 1000 mg via INTRAVENOUS
  Filled 2016-06-17: qty 200

## 2016-06-17 MED ORDER — SODIUM CHLORIDE 0.9 % IV SOLN
INTRAVENOUS | Status: AC
  Administered 2016-06-17 – 2016-06-18 (×2): via INTRAVENOUS

## 2016-06-17 MED ORDER — PIPERACILLIN-TAZOBACTAM 3.375 G IVPB
3.3750 g | Freq: Three times a day (TID) | INTRAVENOUS | Status: DC
  Administered 2016-06-17 – 2016-06-19 (×5): 3.375 g via INTRAVENOUS
  Filled 2016-06-17 (×8): qty 50

## 2016-06-17 MED ORDER — HYDROCODONE-ACETAMINOPHEN 5-325 MG PO TABS: 1.0000 | ORAL_TABLET | Freq: Four times a day (QID) | ORAL | Status: DC | PRN

## 2016-06-17 MED ORDER — SODIUM CHLORIDE 0.9 % IV BOLUS (SEPSIS)
1000.0000 mL | Freq: Once | INTRAVENOUS | Status: AC
  Administered 2016-06-17: 1000 mL via INTRAVENOUS

## 2016-06-17 MED ORDER — ENOXAPARIN SODIUM 40 MG/0.4ML ~~LOC~~ SOLN: 40.0000 mg | SUBCUTANEOUS | Status: DC

## 2016-06-17 MED ORDER — SODIUM CHLORIDE 0.9 % IV BOLUS (SEPSIS)
500.0000 mL | Freq: Once | INTRAVENOUS | Status: AC
  Administered 2016-06-17: 500 mL via INTRAVENOUS

## 2016-06-17 MED ORDER — FUROSEMIDE 20 MG PO TABS
20.0000 mg | ORAL_TABLET | Freq: Every day | ORAL | Status: DC
  Administered 2016-06-20: 20 mg via ORAL
  Filled 2016-06-17 (×2): qty 1

## 2016-06-17 MED ORDER — HYDROMORPHONE HCL 2 MG/ML IJ SOLN
0.5000 mg | INTRAMUSCULAR | Status: DC | PRN
  Administered 2016-06-17 – 2016-06-20 (×9): 0.5 mg via INTRAVENOUS
  Filled 2016-06-17 (×9): qty 1

## 2016-06-17 MED ORDER — OXYCODONE-ACETAMINOPHEN 5-325 MG PO TABS: 1.0000 | ORAL_TABLET | ORAL | Status: DC | PRN

## 2016-06-17 MED ORDER — PROMETHAZINE HCL 25 MG/ML IJ SOLN: 6.2500 mg | Freq: Four times a day (QID) | INTRAMUSCULAR | Status: DC | PRN

## 2016-06-17 MED ORDER — ACETAMINOPHEN 650 MG RE SUPP: 650.0000 mg | Freq: Four times a day (QID) | RECTAL | Status: DC | PRN

## 2016-06-17 MED ORDER — PAROXETINE HCL 10 MG PO TABS
10.0000 mg | ORAL_TABLET | Freq: Every day | ORAL | Status: DC
Start: 2016-06-17 — End: 2016-06-17

## 2016-06-17 MED ORDER — TRAMADOL HCL 50 MG PO TABS: 50.0000 mg | ORAL_TABLET | Freq: Two times a day (BID) | ORAL | Status: DC | PRN

## 2016-06-17 NOTE — ED Notes (Signed)
Family in room  

## 2016-06-17 NOTE — H&P (Signed)
History and Physical    [name] [id number] DOB: 12/22/1915 DOA: 06/17/2016  PCP: Rogers Blocker, MD Patient coming from: home  Chief Complaint: generalized weakness  HPI: [name] is a 83 y.o. female with medical history significant for hypothyroidism, hypertension, diabetes, chronic congestive heart failure, breast cancer sensitivity emergency Department chief complaint of generalized weakness. Initial evaluation concerning for sepsis likely related to a large decubitus in sacral area  Information is obtained from the chart. Patient was brought to the emergency department approximately 3 weeks ago with the chief complaint of worsening decubitus ulcer generalized weakness nausea without vomiting. She is provided with oral antibiotics and discharged to home. Today she was transported to the emergency department via EMS from home with reports of worsening lethargy increased weakness decrease urine output. No reports of any nausea vomiting diarrhea constipation. No reports of fever chills. Patient denies pain shortness of breath.  ED Course: In the emergency department she has a rectal temp 99.3 initially is hemodynamically stable and not hypoxic. She is given morphine for pain becomes hypotensive. She is provided with vigorous IV fluids and IV antibiotics were initiated.  Review of Systems: As per HPI otherwise 10 point review of systems negative.   Ambulatory Status:  Chart Review indicates patient is mostly bedbound  Past Medical History:  Diagnosis Date  . Anxiety   . Breast cancer ([place])   . Congestive heart failure, unspecified   . Decubital ulcer   . Diabetes mellitus without complication ([place])   . Essential hypertension, malignant   . Osteoarthrosis, unspecified whether generalized or localized, unspecified site   . Unspecified hypothyroidism   . Unspecified vitamin D deficiency   . Urinary frequency     Past Surgical History:  Procedure Laterality Date  .  ABDOMINAL HYSTERECTOMY    . BREAST LUMPECTOMY    . BREAST LUMPECTOMY    . CATARACT EXTRACTION    . CHOLECYSTECTOMY      Social History   Social History  . Marital status: Widowed    Spouse name: N/A  . Number of children: N/A  . Years of education: N/A   Occupational History  . Not on file.   Social History Main Topics  . Smoking status: Former Research scientist (life sciences)  . Smokeless tobacco: Not on file  . Alcohol use No  . Drug use: No  . Sexual activity: Not on file   Other Topics Concern  . Not on file   Social History Narrative  . No narrative on file   Chart review indicates patient lives at home alone Allergies  Allergen Reactions  . Codeine Nausea And Vomiting  . Penicillins     Has patient had a PCN reaction causing immediate rash, facial/tongue/throat swelling, SOB or lightheadedness with hypotension:unsure Has patient had a PCN reaction causing severe rash involving mucus membranes or skin necrosis:unsure Has patient had a PCN reaction that required hospitalization:unsure Has patient had a PCN reaction occurring within the last 10 years:unsure If all of the above answers are "NO", then may proceed with Cephalosporin use.     No family history on file.  Prior to Admission medications   Medication Sig Start Date End Date Taking? Authorizing Provider  ALPRAZolam Duanne Moron) 0.5 MG tablet Take 0.5 mg by mouth 2 (two) times daily as needed for anxiety.     Historical Provider, MD  carvedilol (COREG) 3.125 MG tablet Take 3.125 mg by mouth 2 (two) times daily with a meal.     Historical Provider,  MD  cholecalciferol (VITAMIN D) 1000 UNITS tablet Take 1,000 Units by mouth daily.    Historical Provider, MD  docusate sodium (COLACE) 100 MG capsule Take 1 capsule (100 mg total) by mouth every 12 (twelve) hours. 01/07/16   [name], MD  doxycycline (VIBRAMYCIN) 100 MG capsule Take 1 capsule (100 mg total) by mouth 2 (two) times daily. One po bid x 7 days 05/21/16   Domenic Moras, PA-C    furosemide (LASIX) 20 MG tablet Take 20 mg by mouth daily.    Historical Provider, MD  gabapentin (NEURONTIN) 100 MG capsule Take 100 mg by mouth at bedtime.    Historical Provider, MD  HYDROcodone-acetaminophen (NORCO/VICODIN) 5-325 MG tablet Take 1 tablet by mouth every 6 (six) hours as needed for moderate pain or severe pain. 01/07/16   [name], MD  hydrOXYzine (ATARAX/VISTARIL) 10 MG tablet Take 10 mg by mouth 3 (three) times daily as needed for itching.    Historical Provider, MD  levothyroxine (SYNTHROID, LEVOTHROID) 88 MCG tablet Take 88 mcg by mouth daily.      Historical Provider, MD  metFORMIN (GLUCOPHAGE) 1000 MG tablet Take 1 tablet (1,000 mg total) by mouth 2 (two) times daily with a meal. 03/16/13   [name], MD  naproxen (NAPROSYN) 375 MG tablet Take 1 tablet (375 mg total) by mouth 2 (two) times daily. Patient taking differently: Take 375 mg by mouth 2 (two) times daily as needed.  06/01/12   [name], MD  PARoxetine (PAXIL) 10 MG tablet Take 10 mg by mouth daily.    Historical Provider, MD  silver sulfADIAZINE (SILVADENE) 1 % cream Apply topically daily. 04/18/14   [name], MD  simvastatin (ZOCOR) 40 MG tablet Take 40 mg by mouth daily. 04/11/14   Historical Provider, MD  spironolactone (ALDACTONE) 25 MG tablet Take 25 mg by mouth daily.      Historical Provider, MD  traMADol (ULTRAM) 50 MG tablet Take 1 tablet (50 mg total) by mouth every 12 (twelve) hours as needed for moderate pain or severe pain. 05/21/16   Domenic Moras, PA-C    Physical Exam: Vitals:   06/17/16 1336 06/17/16 1345 06/17/16 1400 06/17/16 1415  BP: (!) 81/47 (!) 85/41 (!) 81/43 (!) 95/54  Pulse: 72 65 65 64  Resp: 13 13 12 11   Temp: 97.7 F (36.5 C)     TempSrc: Oral     SpO2: 98% 96% 97% 96%     General:  Appears Quite lethargic but opens eyes to verbal stimuli no acute distress. Eyes:  PERRL, EOMI, normal lids, iris ENT:  grossly normal hearing, lips & tongue, mucous  membranes of her mouth are pink and dry Neck:  no LAD, masses or thyromegaly Cardiovascular:  RRR, no m/r/g. Trace LE edema.  Respiratory:   Normal respiratory effort respirations somewhat shallow breath sounds are distant hear no crackles Abdomen:  soft, ntnd, is a bowel sounds no guarding or rebounding Skin:  no rash or induration seen on limited exam. Sacral area with large decubitus with obvious necrotic tissue down to her buttock and expanded with tunneling to other buttock. Very foul odor. Black tissue on edges, yellow tissue diffus Musculoskeletal:  grossly normal tone BUE/BLE, good ROM, no bony abnormality it's without swelling/erythema Psychiatric:  grossly normal mood and affect, speech fluent and appropriate, AOx3 Neurologic:  CN 2-12 grossly intact, moves all extremities in coordinated fashion, sensation intact  Labs on Admission: I have personally reviewed following labs and imaging studies  CBC:  Recent Labs Lab 06/17/16 1212  WBC 10.8*  NEUTROABS 9.3*  HGB 12.6  HCT 39.2  MCV 96.1  PLT [id number]   Basic Metabolic Panel:  Recent Labs Lab 06/17/16 1212  NA 139  K 3.6  CL 102  CO2 23  GLUCOSE 88  BUN 17  CREATININE 0.67  CALCIUM 9.2   GFR: CrCl cannot be calculated (Unknown ideal weight.). Liver Function Tests:  Recent Labs Lab 06/17/16 1212  AST 37  ALT 30  ALKPHOS 136*  BILITOT 1.4*  PROT 5.7*  ALBUMIN 2.1*   No results for input(s): LIPASE, AMYLASE in the last 168 hours. No results for input(s): AMMONIA in the last 168 hours. Coagulation Profile:  Recent Labs Lab 06/17/16 1212  INR 1.15   Cardiac Enzymes: No results for input(s): CKTOTAL, CKMB, CKMBINDEX, TROPONINI in the last 168 hours. BNP (last 3 results) No results for input(s): PROBNP in the last 8760 hours. HbA1C: No results for input(s): HGBA1C in the last 72 hours. CBG: No results for input(s): GLUCAP in the last 168 hours. Lipid Profile: No results for input(s): CHOL, HDL,  LDLCALC, TRIG, CHOLHDL, LDLDIRECT in the last 72 hours. Thyroid Function Tests: No results for input(s): TSH, T4TOTAL, FREET4, T3FREE, THYROIDAB in the last 72 hours. Anemia Panel: No results for input(s): VITAMINB12, FOLATE, FERRITIN, TIBC, IRON, RETICCTPCT in the last 72 hours. Urine analysis:    Component Value Date/Time   COLORURINE YELLOW 03/15/2013 0250   APPEARANCEUR CLEAR 03/15/2013 0250   LABSPEC 1.019 03/15/2013 0250   PHURINE 6.0 03/15/2013 0250   GLUCOSEU NEGATIVE 03/15/2013 0250   HGBUR TRACE (A) 03/15/2013 0250   BILIRUBINUR SMALL (A) 03/15/2013 0250   KETONESUR 40 (A) 03/15/2013 0250   PROTEINUR 100 (A) 03/15/2013 0250   UROBILINOGEN 1.0 03/15/2013 0250   NITRITE NEGATIVE 03/15/2013 0250   LEUKOCYTESUR NEGATIVE 03/15/2013 0250    Creatinine Clearance: CrCl cannot be calculated (Unknown ideal weight.).  Sepsis Labs: @LABRCNTIP (procalcitonin:4,lacticidven:4) )No results found for this or any previous visit (from the past 240 hour(s)).   Radiological Exams on Admission: Dg Chest 1 View  Result Date: 06/17/2016 CLINICAL DATA:  Sacral decubitus ulcer.  Evaluation for sepsis. EXAM: CHEST 1 VIEW COMPARISON:  Chest radiograph 03/15/2013 FINDINGS: The heart size and mediastinal contours are within normal limits. Both lungs are clear. The visualized skeletal structures are unremarkable. IMPRESSION: No active disease. Electronically Signed   By: Ulyses Jarred M.D.   On: 06/17/2016 13:30   Dg Sacrum/coccyx  Result Date: 06/17/2016 CLINICAL DATA:  Sacral decubitus ulcer with infection and sepsis. EXAM: SACRUM AND COCCYX - 2+ VIEW COMPARISON:  CT scan 03/15/2013 FINDINGS: Extensive calcified injection granulomas noted in the like regions bilaterally the. Severe chronic right hip disease. The pubic symphysis and SI joints are grossly normal. The sacrum is grossly intact. Air noted in the soft tissues from the decubitus ulcer extending very close down to the sacrum. Moderate stool  noted in the rectum. IMPRESSION: Deep decubitus ulcer extending down close to the sacrum. No obvious destructive bony changes to suggest osteomyelitis by plain films. Severe right hip joint degenerative changes. Numerous calcified injection granulomas bilaterally. Electronically Signed   By: Marijo Sanes M.D.   On: 06/17/2016 13:27    EKG: Independently reviewed. Wide-QRS tachycardia Nonspecific intraventricular conduction delay  Assessment/Plan Principal Problem:   Decubital ulcer Active Problems:   HTN (hypertension)   History of CHF (congestive heart failure)   Hypothyroidism   DNR no code (do not resuscitate)  Cellulitis   Hypotension   Acute encephalopathy   #1. Decubitus ulcer/cellulitis. Chart review indicates she came to the emergency department 3 weeks ago for evaluation of same. Daughter indicated at that time wound chronic for over a year but more recently has become painful with drainage and foul odor. No leukocytosis rectal temp 99.3 lactic acid elevated acute encephalopathy hemodynamically stable until morphine and she became hypotensive tachycardia or tachypnea. X-ray without indication for osteomyelitis.  Daughter indicates patient is a DO NOT RESUSCITATE but they would like fluids and antibiotics -Admit -General surgery consult for possible debridement -Gentle IV fluids -IV antibiotics per protocol -Track lactic acid  #2. Generalized weakness/acute encephalopathy. Likely related to above. Patient received morphine in the emergency department. Family indicates decreased oral intake over the last couple of days. -IV antibiotics -IV fluids as noted above -Check vitamin B-12 and folate -Physical therapy -Hold gabapentin and xanax  3. Hypotension. Likely related to morphine given in the emergency department in the setting of #1. She does have an elevated lactic acid but no tachycardia no tachypnea no leukocytosis. Home medications include Coreg, Lasix,  spironolactone -We'll hold these for now -Continue IV fluids -Monitor  4. history of CHF. Likely diastolic.  Appears a little on the dry side. Home meds include Lasix and Coreg -We'll hold these for now -Monitor urine output -Obtain daily weights   DVT prophylaxis: heparin  Code Status: dnr  Family Communication: none in room  Disposition Plan: may benefit snf  Consults called: general surgery Brooke  Admission status: obs    Dyanne Carrel M MD [facility]  If 7PM-7AM, please contact night-coverage [email or web address] Password [facility]  06/17/2016, 2:49 PM

## 2016-06-17 NOTE — ED Notes (Signed)
Patient transported to X-ray pt.is back

## 2016-06-17 NOTE — ED Notes (Signed)
Pt back from xray, hooked back up to monitor ,  Family in room, pt more comfortable

## 2016-06-17 NOTE — Progress Notes (Addendum)
Pharmacy Antibiotic Note  [name] is a 83 y.o. female admitted on 06/17/2016 with cellulitis. Large decub ulcer on buttocks, necrosed, open wound. Pharmacy has been consulted for vancomycin/zosyn dosing. Afebrile, WBC 10.8. SCr 0.67 on admit, CrCl~27.  History of unknown PCN allergy noted but tolerated 1st dose of Zosyn in the ED.  Plan: Zosyn 3.375g IV (39min inf) x1; 3.375g IV q8h (4h inf) Vancomycin 1g IV x1; 1g IV q48h Monitor clinical progress, c/s, renal function, abx plan/LOT Vancomycin trough as indicated      Temp (24hrs), Avg:99.3 F (37.4 C), Min:99.3 F (37.4 C), Max:99.3 F (37.4 C)  No results for input(s): WBC, CREATININE, LATICACIDVEN, VANCOTROUGH, VANCOPEAK, VANCORANDOM, GENTTROUGH, GENTPEAK, GENTRANDOM, TOBRATROUGH, TOBRAPEAK, TOBRARND, AMIKACINPEAK, AMIKACINTROU, AMIKACIN in the last 168 hours.  CrCl cannot be calculated (Patient's most recent lab result is older than the maximum 21 days allowed.).    Allergies  Allergen Reactions  . Codeine Nausea And Vomiting  . Penicillins     Has patient had a PCN reaction causing immediate rash, facial/tongue/throat swelling, SOB or lightheadedness with hypotension:unsure Has patient had a PCN reaction causing severe rash involving mucus membranes or skin necrosis:unsure Has patient had a PCN reaction that required hospitalization:unsure Has patient had a PCN reaction occurring within the last 10 years:unsure If all of the above answers are "NO", then may proceed with Cephalosporin use.     Elicia Lamp, PharmD, BCPS Clinical Pharmacist 06/17/2016 12:17 PM

## 2016-06-17 NOTE — Progress Notes (Signed)
CRITICAL VALUE ALERT  Critical value received:  Lactic acid of 2.0 Troponin .07  Date of notification:  06/17/16  Time of notification:  2043  Critical value read back:Yes.    Nurse who received alert:  Georgie Chard   MD notified (1st page):  Schorr NP  Time of first page:  2045  MD notified (2nd page):  Time of second page:  Responding MD:  Schorr NP  Time MD responded:  2059 with order for 587ml bolus

## 2016-06-17 NOTE — ED Notes (Addendum)
Patients BP remains in the [id number] systolic after 4mg  morphine givne approximately 1300.  Marily Memos MD and Lurlean Leyden NP made aware and ordered 500 cc bolus.  Will continue to monitor patient closely.  No family at bedside

## 2016-06-17 NOTE — Consult Note (Signed)
[facility] Surgery Consult Note  [name] 30-May-1915  [id number].    Requesting MD: Marily Memos Chief Complaint/Reason for Consult: Sacral decubitus ulcer  HPI:  [name] is a 83yo female who was transferred to [facility] via EMS from home with reports of worsening lethargy, increased weakness, and decreased urine output. No family at bedside during our visit, and patient is not a great historian, therefore the majority of her history was taken from her chart. General surgery was consulted for evaluation of large sacral decubitus ulcer. Per chart patient was brought to the ED about 3 weeks ago for her decubitus ulcer and she was discharged home on oral antibiotics. Patient continues to say "i'm cold" and "I hurt."  PMH significant for  hypothyroidism, hypertension, diabetes, chronic congestive heart failure, breast cancer  Abdominal surgical history chole hysterectomy, cholecystectomy Anticoagulants: none Initially told that patient lives at home by herself and is bed bound. There is also documentation that patient lives with one of her sons  ROS: ROS Unable to perform ROS as patient is not a good historian  No family history on file.  Past Medical History:  Diagnosis Date  . Anxiety   . Breast cancer ([place])   . Congestive heart failure, unspecified   . Decubital ulcer   . Diabetes mellitus without complication ([place])   . Essential hypertension, malignant   . Osteoarthrosis, unspecified whether generalized or localized, unspecified site   . Unspecified hypothyroidism   . Unspecified vitamin D deficiency   . Urinary frequency     Past Surgical History:  Procedure Laterality Date  . ABDOMINAL HYSTERECTOMY    . BREAST LUMPECTOMY    . BREAST LUMPECTOMY    . CATARACT EXTRACTION    . CHOLECYSTECTOMY      Social History:  reports that she has quit smoking. She does not have any smokeless tobacco history on file. She reports that she does not drink alcohol or use  drugs.  Allergies:  Allergies  Allergen Reactions  . Codeine Nausea And Vomiting  . Penicillins Other (See Comments)    Has patient had a PCN reaction causing immediate rash, facial/tongue/throat swelling, SOB or lightheadedness with hypotension:unsure Has patient had a PCN reaction causing severe rash involving mucus membranes or skin necrosis:unsure Has patient had a PCN reaction that required hospitalization:unsure Has patient had a PCN reaction occurring within the last 10 years:unsure If all of the above answers are "NO", then may proceed with Cephalosporin use.      (Not in a hospital admission)  Prior to Admission medications   Medication Sig Start Date End Date Taking? Authorizing Provider  carvedilol (COREG) 3.125 MG tablet Take 3.125 mg by mouth 2 (two) times daily with a meal.    Yes Historical Provider, MD  gabapentin (NEURONTIN) 100 MG capsule Take 100 mg by mouth at bedtime.   Yes Historical Provider, MD  hydrOXYzine (ATARAX/VISTARIL) 10 MG tablet Take 10 mg by mouth 3 (three) times daily as needed for itching.   Yes Historical Provider, MD  levothyroxine (SYNTHROID, LEVOTHROID) 88 MCG tablet Take 88 mcg by mouth daily.     Yes Historical Provider, MD  naproxen (NAPROSYN) 375 MG tablet Take 1 tablet (375 mg total) by mouth 2 (two) times daily. Patient taking differently: Take 375 mg by mouth 2 (two) times daily as needed.  06/01/12  Yes [name], MD  spironolactone (ALDACTONE) 25 MG tablet Take 25 mg by mouth daily.     Yes Historical Provider, MD  ALPRAZolam (XANAX) 0.5 MG tablet Take 0.5 mg by mouth 2 (two) times daily as needed for anxiety.     Historical Provider, MD  cholecalciferol (VITAMIN D) 1000 UNITS tablet Take 1,000 Units by mouth daily.    Historical Provider, MD  docusate sodium (COLACE) 100 MG capsule Take 1 capsule (100 mg total) by mouth every 12 (twelve) hours. Patient not taking: Reported on 06/17/2016 01/07/16   [name], MD  doxycycline  (VIBRAMYCIN) 100 MG capsule Take 1 capsule (100 mg total) by mouth 2 (two) times daily. One po bid x 7 days Patient not taking: Reported on 06/17/2016 05/21/16   Domenic Moras, PA-C  furosemide (LASIX) 20 MG tablet Take 20 mg by mouth daily.    Historical Provider, MD  HYDROcodone-acetaminophen (NORCO/VICODIN) 5-325 MG tablet Take 1 tablet by mouth every 6 (six) hours as needed for moderate pain or severe pain. Patient not taking: Reported on 06/17/2016 01/07/16   [name], MD  metFORMIN (GLUCOPHAGE) 1000 MG tablet Take 1 tablet (1,000 mg total) by mouth 2 (two) times daily with a meal. Patient not taking: Reported on 06/17/2016 03/16/13   [name], MD  PARoxetine (PAXIL) 10 MG tablet Take 10 mg by mouth daily.    Historical Provider, MD  silver sulfADIAZINE (SILVADENE) 1 % cream Apply topically daily. Patient not taking: Reported on 06/17/2016 04/18/14   [name], MD  simvastatin (ZOCOR) 40 MG tablet Take 40 mg by mouth daily. 04/11/14   Historical Provider, MD  traMADol (ULTRAM) 50 MG tablet Take 1 tablet (50 mg total) by mouth every 12 (twelve) hours as needed for moderate pain or severe pain. Patient not taking: Reported on 06/17/2016 05/21/16   Domenic Moras, PA-C    Blood pressure (!) 94/54, pulse 62, temperature 97.7 F (36.5 C), temperature source Oral, resp. rate 11, SpO2 97 %. Physical Exam: General: frail, chronically ill appearing AA female who is laying in bed in NAD HEENT: head is normocephalic, atraumatic.  Sclera are noninjected.  Mouth is dry Heart: regular, rate, and rhythm.  No obvious murmurs, gallops, or rubs noted.  Palpable pedal pulses bilaterally Lungs: CTAB, no wheezes, rhonchi, or rales noted.  Respiratory effort nonlabored Abd: incisions, soft, NT/ND, +BS, no masses, hernias, or organomegaly MS: all 4 extremities are symmetrical with no cyanosis or edema. Mild BLE edema equal bilaterally Psych: alert but not oriented to person, time, or place Neuro: CM 2-12  intact, extremity CSM intact bilaterally, normal speech Skin: large sacral decubitus ulcer with necrotic tissue and foul odor: see picture from earlier today  Results for orders placed or performed during the hospital encounter of 06/17/16 (from the past 48 hour(s))  CBC with Differential     Status: Abnormal   Collection Time: 06/17/16 12:12 PM  Result Value Ref Range   WBC 10.8 (H) 4.0 - 10.5 K/uL   RBC 4.08 3.87 - 5.11 MIL/uL   Hemoglobin 12.6 12.0 - 15.0 g/dL   HCT 39.2 36.0 - 46.0 %   MCV 96.1 78.0 - 100.0 fL   MCH 30.9 26.0 - 34.0 pg   MCHC 32.1 30.0 - 36.0 g/dL   RDW 16.1 (H) 11.5 - 15.5 %   Platelets 321 150 - 400 K/uL   Neutrophils Relative % 86 %   Neutro Abs 9.3 (H) 1.7 - 7.7 K/uL   Lymphocytes Relative 7 %   Lymphs Abs 0.8 0.7 - 4.0 K/uL   Monocytes Relative 7 %   Monocytes Absolute 0.7 0.1 - 1.0  K/uL   Eosinophils Relative 0 %   Eosinophils Absolute 0.0 0.0 - 0.7 K/uL   Basophils Relative 0 %   Basophils Absolute 0.0 0.0 - 0.1 K/uL  Comprehensive metabolic panel     Status: Abnormal   Collection Time: 06/17/16 12:12 PM  Result Value Ref Range   Sodium 139 135 - 145 mmol/L   Potassium 3.6 3.5 - 5.1 mmol/L    Comment: HEMOLYSIS AT THIS LEVEL MAY AFFECT RESULT   Chloride 102 101 - 111 mmol/L   CO2 23 22 - 32 mmol/L   Glucose, Bld 88 65 - 99 mg/dL   BUN 17 6 - 20 mg/dL   Creatinine, Ser 0.67 0.44 - 1.00 mg/dL   Calcium 9.2 8.9 - 10.3 mg/dL   Total Protein 5.7 (L) 6.5 - 8.1 g/dL   Albumin 2.1 (L) 3.5 - 5.0 g/dL   AST 37 15 - 41 U/L   ALT 30 14 - 54 U/L   Alkaline Phosphatase 136 (H) 38 - 126 U/L   Total Bilirubin 1.4 (H) 0.3 - 1.2 mg/dL   GFR calc non Af Amer >60 >60 mL/min   GFR calc Af Amer >60 >60 mL/min    Comment: (NOTE) The eGFR has been calculated using the CKD EPI equation. This calculation has not been validated in all clinical situations. eGFR's persistently <60 mL/min signify possible Chronic Kidney Disease.    Anion gap 14 5 - 15  Lactic acid,  plasma     Status: Abnormal   Collection Time: 06/17/16 12:12 PM  Result Value Ref Range   Lactic Acid, Venous 2.0 (HH) 0.5 - 1.9 mmol/L    Comment: CRITICAL RESULT CALLED TO, READ BACK BY AND VERIFIED WITH: C.ROUGHGARDEN,RN 06/17/16 1252 BY BSLADE   Procalcitonin     Status: None   Collection Time: 06/17/16 12:12 PM  Result Value Ref Range   Procalcitonin 0.17 ng/mL    Comment:        Interpretation: PCT (Procalcitonin) <= 0.5 ng/mL: Systemic infection (sepsis) is not likely. Local bacterial infection is possible. (NOTE)         ICU PCT Algorithm               Non ICU PCT Algorithm    ----------------------------     ------------------------------         PCT < 0.25 ng/mL                 PCT < 0.1 ng/mL     Stopping of antibiotics            Stopping of antibiotics       strongly encouraged.               strongly encouraged.    ----------------------------     ------------------------------       PCT level decrease by               PCT < 0.25 ng/mL       >= 80% from peak PCT       OR PCT 0.25 - 0.5 ng/mL          Stopping of antibiotics                                             encouraged.     Stopping of antibiotics  encouraged.    ----------------------------     ------------------------------       PCT level decrease by              PCT >= 0.25 ng/mL       < 80% from peak PCT        AND PCT >= 0.5 ng/mL            Continuin g antibiotics                                              encouraged.       Continuing antibiotics            encouraged.    ----------------------------     ------------------------------     PCT level increase compared          PCT > 0.5 ng/mL         with peak PCT AND          PCT >= 0.5 ng/mL             Escalation of antibiotics                                          strongly encouraged.      Escalation of antibiotics        strongly encouraged.   Protime-INR     Status: None   Collection Time: 06/17/16 12:12 PM  Result Value  Ref Range   Prothrombin Time 14.8 11.4 - 15.2 seconds   INR 1.15   APTT     Status: None   Collection Time: 06/17/16 12:12 PM  Result Value Ref Range   aPTT 30 24 - 36 seconds   Dg Chest 1 View  Result Date: 06/17/2016 CLINICAL DATA:  Sacral decubitus ulcer.  Evaluation for sepsis. EXAM: CHEST 1 VIEW COMPARISON:  Chest radiograph 03/15/2013 FINDINGS: The heart size and mediastinal contours are within normal limits. Both lungs are clear. The visualized skeletal structures are unremarkable. IMPRESSION: No active disease. Electronically Signed   By: Ulyses Jarred M.D.   On: 06/17/2016 13:30   Dg Sacrum/coccyx  Result Date: 06/17/2016 CLINICAL DATA:  Sacral decubitus ulcer with infection and sepsis. EXAM: SACRUM AND COCCYX - 2+ VIEW COMPARISON:  CT scan 03/15/2013 FINDINGS: Extensive calcified injection granulomas noted in the like regions bilaterally the. Severe chronic right hip disease. The pubic symphysis and SI joints are grossly normal. The sacrum is grossly intact. Air noted in the soft tissues from the decubitus ulcer extending very close down to the sacrum. Moderate stool noted in the rectum. IMPRESSION: Deep decubitus ulcer extending down close to the sacrum. No obvious destructive bony changes to suggest osteomyelitis by plain films. Severe right hip joint degenerative changes. Numerous calcified injection granulomas bilaterally. Electronically Signed   By: Marijo Sanes M.D.   On: 06/17/2016 13:27      Assessment/Plan Sacral decubitus ulcer - XR shows deep decubitus ulcer extending down close to the sacrum. No obvious destructive bony changes to suggest osteomyelitis by plain films. - WBC 10.8, afebrile, lactic acid 2.0  ID - zosyn 2/27>>, vanco 2/27>>  Plan - No family at bedside. Need to establish goals of care. This type of ulcer would typically benefit from OR  debridement, but due to patient's comorbidities and DNR status may want to consider conservative management. Would  recommend palliative consult. Will continue to follow.  Jerrye Beavers, [facility] Surgery 06/17/2016, 4:30 PM Pager: [phone] Consults: [phone] Mon-Fri 7:00 am-4:30 pm Sat-Sun 7:00 am-11:30 am

## 2016-06-17 NOTE — Progress Notes (Signed)
MD. Saralyn Pilar notified of pt lab critical  Lactic value of 2.9.

## 2016-06-17 NOTE — ED Notes (Signed)
Admit dr into see pt 

## 2016-06-17 NOTE — Progress Notes (Addendum)
AVERII PAVY is a 83 y.o. female patient admitted from ED with fatigue and failure to thrive. Pt is alert to self, family arrived in the room with patient. Deep tissue injury noted on pt bilateral buttocks and sacrum upon arrival from ED. Pressure ulcer was measured to be  1cm deep,  length to the right side was 5cm, 10cm wide. Necroitic tissue which can be termed as unstageable ulcer  also noted all over the left side of the buttocks. VSS - Blood pressure (!) 92/57, pulse 80, temperature 97.7 F (36.5 C), temperature source Oral, resp. rate 11, SpO2 97 %.    IV in place, occlusive dsg intact without redness. Called MD to order wet to dry dressing, which was done immediately after orders were acknowledged. Dressing intact at this time and will be changed again by the night nurse Will cont to eval and treat per MD orders.  Dorris Carnes, RN 06/17/2016 5:16 PM

## 2016-06-17 NOTE — ED Notes (Signed)
Patient remains confused at this time.  BP is at 94/54.  Family is now at the bedside.  General surgery called back and informed family is here to talk about debridement.

## 2016-06-17 NOTE — Evaluation (Signed)
Wound as below at time of admission

## 2016-06-17 NOTE — ED Notes (Signed)
Pt has large decub on her bottom that has foul smell and has necrosed  thru to her buttock and expanded to large  open wound to buttock area , has black around edges and into wound, .

## 2016-06-17 NOTE — ED Notes (Signed)
Patient is stable and ready to be transport to the floor at this time.  Report was called to 5W RN.  Belongings taken with the patient to the floor.   

## 2016-06-17 NOTE — Progress Notes (Signed)
CRITICAL VALUE ALERT  Critical value received:  Lactic 2.1  Date of notification:  06/17/16  Time of notification:  2340  Critical value read back:Yes.    Nurse who received alert:  Georgie Chard  MD notified (1st page):  Schorr NP  Time of first page:  2342  Responding MD:  Schorr NP  Time MD responded:  0006 with new order for 227ml bolus

## 2016-06-18 DIAGNOSIS — G9340 Encephalopathy, unspecified: Secondary | ICD-10-CM

## 2016-06-18 DIAGNOSIS — L89150 Pressure ulcer of sacral region, unstageable: Secondary | ICD-10-CM

## 2016-06-18 DIAGNOSIS — Z8679 Personal history of other diseases of the circulatory system: Secondary | ICD-10-CM

## 2016-06-18 DIAGNOSIS — Z515 Encounter for palliative care: Secondary | ICD-10-CM

## 2016-06-18 DIAGNOSIS — Z7189 Other specified counseling: Secondary | ICD-10-CM

## 2016-06-18 LAB — BASIC METABOLIC PANEL
ANION GAP: 10 (ref 5–15)
BUN: 10 mg/dL (ref 6–20)
CALCIUM: 8.2 mg/dL — AB (ref 8.9–10.3)
CO2: 21 mmol/L — AB (ref 22–32)
Chloride: 111 mmol/L (ref 101–111)
Creatinine, Ser: 0.54 mg/dL (ref 0.44–1.00)
GFR calc non Af Amer: >60 mL/min (ref >60)
Glucose, Bld: 77 mg/dL (ref 65–99)
Potassium: 3.2 mmol/L — ABNORMAL LOW (ref 3.5–5.1)
SODIUM: 142 mmol/L (ref 135–145)

## 2016-06-18 LAB — CBC
HCT: 35.5 % — ABNORMAL LOW (ref 36.0–46.0)
HEMOGLOBIN: 11.2 g/dL — AB (ref 12.0–15.0)
MCH: 30.5 pg (ref 26.0–34.0)
MCHC: 31.5 g/dL (ref 30.0–36.0)
MCV: 96.7 fL (ref 78.0–100.0)
PLATELETS: 286 10*3/uL (ref 150–400)
RBC: 3.67 MIL/uL — AB (ref 3.87–5.11)
RDW: 16.4 % — ABNORMAL HIGH (ref 11.5–15.5)
WBC: 10.7 10*3/uL — AB (ref 4.0–10.5)

## 2016-06-18 MED ORDER — POTASSIUM CHLORIDE CRYS ER 20 MEQ PO TBCR
60.0000 meq | EXTENDED_RELEASE_TABLET | Freq: Once | ORAL | Status: DC
  Filled 2016-06-18 (×2): qty 3

## 2016-06-18 MED ORDER — SODIUM CHLORIDE 0.9 % IV BOLUS (SEPSIS)
250.0000 mL | Freq: Once | INTRAVENOUS | Status: AC
  Administered 2016-06-18: 250 mL via INTRAVENOUS

## 2016-06-18 MED ORDER — ORAL CARE MOUTH RINSE
15.0000 mL | Freq: Two times a day (BID) | OROMUCOSAL | Status: DC
  Administered 2016-06-19 – 2016-06-20 (×3): 15 mL via OROMUCOSAL

## 2016-06-18 MED ORDER — CHLORHEXIDINE GLUCONATE 0.12 % MT SOLN
15.0000 mL | Freq: Two times a day (BID) | OROMUCOSAL | Status: DC
  Administered 2016-06-18 – 2016-06-20 (×5): 15 mL via OROMUCOSAL
  Filled 2016-06-18 (×5): qty 15

## 2016-06-18 MED ORDER — DAKINS (1/4 STRENGTH) 0.125 % EX SOLN
Freq: Two times a day (BID) | CUTANEOUS | Status: DC
  Administered 2016-06-18 – 2016-06-19 (×3)
  Administered 2016-06-20: 1
  Filled 2016-06-18 (×4): qty 473

## 2016-06-18 NOTE — Progress Notes (Signed)
Patient ID: [name], female   DOB: [birth year], 83 y.o.   MRN: [id number]  [facility] Surgery Progress Note     Subjective: Patient lying in bed. States that she continues to have pain in her back. Family coming later today around 1130.  Objective: Vital signs in last 24 hours: Temp:  [97.7 F (36.5 C)-99.3 F (37.4 C)] 98 F (36.7 C) (02/28 0525) Pulse Rate:  [62-95] 92 (02/28 0525) Resp:  [11-21] 18 (02/28 0525) BP: (78-123)/(41-102) 106/65 (02/28 0525) SpO2:  [96 %-100 %] 99 % (02/28 0525)    Intake/Output from previous day: 02/27 0701 - 02/28 0700 In: 100 [IV Piggyback:100] Out: -  Intake/Output this shift: No intake/output data recorded.  PE: Gen:  Alert, NAD, pleasant Card:  RRR, no M/G/R heard Pulm:  CTAB, no W/R/R, effort normal Abd: Soft, NT/ND, +BS, no HSM Skin: large sacral decubitus ulcer with necrotic tissue and foul odor, unchanged Ext:  No erythema, edema, or tenderness   Lab Results:   Recent Labs  06/17/16 1212 06/18/16 0602  WBC 10.8* 10.7*  HGB 12.6 11.2*  HCT 39.2 35.5*  PLT 321 286   BMET  Recent Labs  06/17/16 1212 06/18/16 0602  NA 139 142  K 3.6 3.2*  CL 102 111  CO2 23 21*  GLUCOSE 88 77  BUN 17 10  CREATININE 0.67 0.54  CALCIUM 9.2 8.2*   PT/INR  Recent Labs  06/17/16 1212  LABPROT 14.8  INR 1.15   CMP     Component Value Date/Time   NA 142 06/18/2016 0602   K 3.2 (L) 06/18/2016 0602   CL 111 06/18/2016 0602   CO2 21 (L) 06/18/2016 0602   GLUCOSE 77 06/18/2016 0602   BUN 10 06/18/2016 0602   CREATININE 0.54 06/18/2016 0602   CALCIUM 8.2 (L) 06/18/2016 0602   PROT 5.7 (L) 06/17/2016 1212   ALBUMIN 2.1 (L) 06/17/2016 1212   AST 37 06/17/2016 1212   ALT 30 06/17/2016 1212   ALKPHOS 136 (H) 06/17/2016 1212   BILITOT 1.4 (H) 06/17/2016 1212   GFRNONAA >60 06/18/2016 0602   GFRAA >60 06/18/2016 0602   Lipase  No results found for: LIPASE     Studies/Results: Dg Chest 1 View  Result Date:  06/17/2016 CLINICAL DATA:  Sacral decubitus ulcer.  Evaluation for sepsis. EXAM: CHEST 1 VIEW COMPARISON:  Chest radiograph 03/15/2013 FINDINGS: The heart size and mediastinal contours are within normal limits. Both lungs are clear. The visualized skeletal structures are unremarkable. IMPRESSION: No active disease. Electronically Signed   By: Ulyses Jarred M.D.   On: 06/17/2016 13:30   Dg Sacrum/coccyx  Result Date: 06/17/2016 CLINICAL DATA:  Sacral decubitus ulcer with infection and sepsis. EXAM: SACRUM AND COCCYX - 2+ VIEW COMPARISON:  CT scan 03/15/2013 FINDINGS: Extensive calcified injection granulomas noted in the like regions bilaterally the. Severe chronic right hip disease. The pubic symphysis and SI joints are grossly normal. The sacrum is grossly intact. Air noted in the soft tissues from the decubitus ulcer extending very close down to the sacrum. Moderate stool noted in the rectum. IMPRESSION: Deep decubitus ulcer extending down close to the sacrum. No obvious destructive bony changes to suggest osteomyelitis by plain films. Severe right hip joint degenerative changes. Numerous calcified injection granulomas bilaterally. Electronically Signed   By: Marijo Sanes M.D.   On: 06/17/2016 13:27    Anti-infectives: Anti-infectives    Start     Dose/Rate Route Frequency Ordered Stop   06/19/16  1230  vancomycin (VANCOCIN) IVPB 1000 mg/200 mL premix     1,000 mg 200 mL/hr over 60 Minutes Intravenous Every 48 hours 06/17/16 1330     06/17/16 2030  piperacillin-tazobactam (ZOSYN) IVPB 3.375 g     3.375 g 12.5 mL/hr over 240 Minutes Intravenous Every 8 hours 06/17/16 1330     06/17/16 1200  piperacillin-tazobactam (ZOSYN) IVPB 3.375 g     3.375 g 100 mL/hr over 30 Minutes Intravenous  Once 06/17/16 1151 06/17/16 1526   06/17/16 1200  vancomycin (VANCOCIN) IVPB 1000 mg/200 mL premix     1,000 mg 200 mL/hr over 60 Minutes Intravenous  Once 06/17/16 1151 06/17/16 1526        Assessment/Plan Sacral decubitus ulcer - XR shows deep decubitus ulcer extending down close to the sacrum. No obvious destructive bony changes to suggest osteomyelitis by plain films. - Likely have three options: comfort care, antibiotics/local wound care, OR for debridement - WBC 10.7, afebrile, normotensive, lactic acid 2.1  ID - zosyn 2/27>>, vanco 2/27>>  Plan - Palliative consulting with family today around 1130. Will await final decision before giving more recommendations. For now continue with antibiotics and daily wet to dry dressing changes.   LOS: 1 day    Jerrye Beavers , [facility] Surgery 06/18/2016, 9:40 AM Pager: [phone] Consults: [phone] Mon-Fri 7:00 am-4:30 pm Sat-Sun 7:00 am-11:30 am

## 2016-06-18 NOTE — Plan of Care (Signed)
Problem: Pain Managment: Goal: General experience of comfort will improve Outcome: Progressing Pt resting comfortably overnight with no pain behaviors present.

## 2016-06-18 NOTE — Progress Notes (Signed)
PROGRESS NOTE    [name]  [id number] DOB: 06/11/1915 DOA: 06/17/2016 PCP: Rogers Blocker, MD    Brief Narrative:  83 y.o. female with medical history significant for hypothyroidism, hypertension, diabetes, chronic congestive heart failure, breast cancer sensitivity emergency Department chief complaint of generalized weakness. Initial evaluation concerning for sepsis likely related to a large decubitus in sacral area  Information is obtained from the chart. Patient was brought to the emergency department approximately 3 weeks ago with the chief complaint of worsening decubitus ulcer generalized weakness nausea without vomiting. She is provided with oral antibiotics and discharged to home. Today she was transported to the emergency department via EMS from home with reports of worsening lethargy increased weakness decrease urine output. No reports of any nausea vomiting diarrhea constipation. No reports of fever chills. Patient denies pain shortness of breath.  Assessment & Plan:   Principal Problem:   Decubital ulcer Active Problems:   HTN (hypertension)   History of CHF (congestive heart failure)   Hypothyroidism   DNR no code (do not resuscitate)   Cellulitis   Hypotension   Acute encephalopathy   Decubitus ulcer of sacral region, unstageable (HCC)   SIRS (systemic inflammatory response syndrome) (HCC)   Weakness   #1. Decubitus ulcer/cellulitis. - Pt presented with chronic non-healing sacral decub ulcer with concerns for developing cellulitis - On empiric IV abx per below - General Surgery consulted. Given plans for more conservative management following meeting with Palliative Care, no surgery is planned at this time - continue pain management as tolerated  #2. Generalized weakness/acute encephalopathy.  - Likely toxicmetabolic encephalopathy secondary to sacral decub -Patient has been continued on abx per above - Appreciate input by Palliative Care. Family seems  to be leaning towards more hospice care. Palliative Care to follow up tomorrow.  3. Hypotension.  - Home medications include Coreg, Lasix, spironolactone - holding bp meds for the time being secondary to hypotension at presentation - bp stable at present  4. history of CHF - appears euvolemic at this time -holding diuretics   DVT prophylaxis: SCD's Code Status: DNR Family Communication: Pt in room, family at bedside Disposition Plan: Uncertain at this time  Consultants:   Palliative Care  General Surgery  Procedures:     Antimicrobials: Anti-infectives    Start     Dose/Rate Route Frequency Ordered Stop   06/19/16 1230  vancomycin (VANCOCIN) IVPB 1000 mg/200 mL premix     1,000 mg 200 mL/hr over 60 Minutes Intravenous Every 48 hours 06/17/16 1330     06/17/16 2030  piperacillin-tazobactam (ZOSYN) IVPB 3.375 g     3.375 g 12.5 mL/hr over 240 Minutes Intravenous Every 8 hours 06/17/16 1330     06/17/16 1200  piperacillin-tazobactam (ZOSYN) IVPB 3.375 g     3.375 g 100 mL/hr over 30 Minutes Intravenous  Once 06/17/16 1151 06/17/16 1526   06/17/16 1200  vancomycin (VANCOCIN) IVPB 1000 mg/200 mL premix     1,000 mg 200 mL/hr over 60 Minutes Intravenous  Once 06/17/16 1151 06/17/16 1526       Subjective: Complaining of back pain  Objective: Vitals:   06/17/16 2313 06/18/16 0525 06/18/16 1200 06/18/16 1356  BP: (!) 91/55 106/65  (!) 83/58  Pulse: 89 92  76  Resp:  18  17  Temp: 97.7 F (36.5 C) 98 F (36.7 C)  97.6 F (36.4 C)  TempSrc: Oral Oral  Oral  SpO2: 98% 99%  99%  Weight:   51.3 kg (  113 lb)   Height:   5\' 3"  (1.6 m)     Intake/Output Summary (Last 24 hours) at 06/18/16 1558 Last data filed at 06/18/16 0537  Gross per 24 hour  Intake              100 ml  Output                0 ml  Net              100 ml   Filed Weights   06/18/16 1200  Weight: 51.3 kg (113 lb)    Examination:  General exam: Appears calm and comfortable  Respiratory  system: Clear to auscultation. Respiratory effort normal. Cardiovascular system: S1 & S2 heard, RRR. Gastrointestinal system: Abdomen is nondistended, soft and nontender. No organomegaly or masses felt. Normal bowel sounds heard. Central nervous system: Alert and oriented. No focal neurological deficits. Extremities: Symmetric 5 x 5 power. Skin: No rashes, normal skin turgor Psychiatry: Judgement and insight appear normal. Mood & affect appropriate.   Data Reviewed: I have personally reviewed following labs and imaging studies  CBC:  Recent Labs Lab 06/17/16 1212 06/18/16 0602  WBC 10.8* 10.7*  NEUTROABS 9.3*  --   HGB 12.6 11.2*  HCT 39.2 35.5*  MCV 96.1 96.7  PLT 321 [id number]   Basic Metabolic Panel:  Recent Labs Lab 06/17/16 1212 06/18/16 0602  NA 139 142  K 3.6 3.2*  CL 102 111  CO2 23 21*  GLUCOSE 88 77  BUN 17 10  CREATININE 0.67 0.54  CALCIUM 9.2 8.2*   GFR: Estimated Creatinine Clearance: 31.8 mL/min (by C-G formula based on SCr of 0.54 mg/dL). Liver Function Tests:  Recent Labs Lab 06/17/16 1212  AST 37  ALT 30  ALKPHOS 136*  BILITOT 1.4*  PROT 5.7*  ALBUMIN 2.1*   No results for input(s): LIPASE, AMYLASE in the last 168 hours. No results for input(s): AMMONIA in the last 168 hours. Coagulation Profile:  Recent Labs Lab 06/17/16 1212  INR 1.15   Cardiac Enzymes:  Recent Labs Lab 06/17/16 1926  TROPONINI 0.07*   BNP (last 3 results) No results for input(s): PROBNP in the last 8760 hours. HbA1C: No results for input(s): HGBA1C in the last 72 hours. CBG: No results for input(s): GLUCAP in the last 168 hours. Lipid Profile: No results for input(s): CHOL, HDL, LDLCALC, TRIG, CHOLHDL, LDLDIRECT in the last 72 hours. Thyroid Function Tests: No results for input(s): TSH, T4TOTAL, FREET4, T3FREE, THYROIDAB in the last 72 hours. Anemia Panel:  Recent Labs  06/17/16 1548  VITAMINB12 723   Sepsis Labs:  Recent Labs Lab 06/17/16 1212  06/17/16 1548 06/17/16 1926 06/17/16 2239  PROCALCITON 0.17  --   --   --   LATICACIDVEN 2.0* 2.9* 2.0* 2.1*    Recent Results (from the past 240 hour(s))  Culture, blood (x 2)     Status: None (Preliminary result)   Collection Time: 06/17/16 12:15 PM  Result Value Ref Range Status   Specimen Description BLOOD RIGHT ANTECUBITAL  Final   Special Requests BOTTLES DRAWN AEROBIC ONLY  10CC  Final   Culture NO GROWTH 1 DAY  Final   Report Status PENDING  Incomplete  Culture, blood (x 2)     Status: None (Preliminary result)   Collection Time: 06/17/16 12:42 PM  Result Value Ref Range Status   Specimen Description BLOOD RIGHT HAND  Final   Special Requests IN PEDIATRIC BOTTLE [place]  Final  Culture NO GROWTH 1 DAY  Final   Report Status PENDING  Incomplete     Radiology Studies: Dg Chest 1 View  Result Date: 06/17/2016 CLINICAL DATA:  Sacral decubitus ulcer.  Evaluation for sepsis. EXAM: CHEST 1 VIEW COMPARISON:  Chest radiograph 03/15/2013 FINDINGS: The heart size and mediastinal contours are within normal limits. Both lungs are clear. The visualized skeletal structures are unremarkable. IMPRESSION: No active disease. Electronically Signed   By: Ulyses Jarred M.D.   On: 06/17/2016 13:30   Dg Sacrum/coccyx  Result Date: 06/17/2016 CLINICAL DATA:  Sacral decubitus ulcer with infection and sepsis. EXAM: SACRUM AND COCCYX - 2+ VIEW COMPARISON:  CT scan 03/15/2013 FINDINGS: Extensive calcified injection granulomas noted in the like regions bilaterally the. Severe chronic right hip disease. The pubic symphysis and SI joints are grossly normal. The sacrum is grossly intact. Air noted in the soft tissues from the decubitus ulcer extending very close down to the sacrum. Moderate stool noted in the rectum. IMPRESSION: Deep decubitus ulcer extending down close to the sacrum. No obvious destructive bony changes to suggest osteomyelitis by plain films. Severe right hip joint degenerative changes.  Numerous calcified injection granulomas bilaterally. Electronically Signed   By: Marijo Sanes M.D.   On: 06/17/2016 13:27    Scheduled Meds: . chlorhexidine  15 mL Mouth Rinse BID  . feeding supplement (ENSURE ENLIVE)  237 mL Oral BID BM  . furosemide  20 mg Oral Daily  . mouth rinse  15 mL Mouth Rinse q12n4p  . piperacillin-tazobactam (ZOSYN)  IV  3.375 g Intravenous Q8H  . potassium chloride  60 mEq Oral Once  . sodium hypochlorite   Irrigation BID  . [START ON 06/19/2016] vancomycin  1,000 mg Intravenous Q48H   Continuous Infusions:   LOS: 1 day   Jermery Caratachea, Orpah Melter, MD [facility] Pager [phone]  If 7PM-7AM, please contact night-coverage [email or web address] Password [facility] 06/18/2016, 3:58 PM

## 2016-06-18 NOTE — Progress Notes (Signed)
Spoke with patient's daughter, [name].  She and her brother are coming to the hospital for a family meeting at 73.  Micheline Rough, MD [place] Team [phone]

## 2016-06-18 NOTE — Consult Note (Signed)
Consultation Note Date: 06/18/2016   Patient Name: [name]  DOB: Sep 11, 1915  MRN: [id number]  Age / Sex: 83 y.o., female  PCP: Rogers Blocker, MD Referring Physician: Donne Hazel, MD  Reason for Consultation: Establishing goals of care  HPI/Patient Profile: 83 y.o.femalewith medical history significant forhypothyroidism, hypertension, diabetes, chronic congestive heart failure, breast cancer admitted with sepsis likely related to a large decubitus in sacral area.  Palliative consulted for goals of care. .  Clinical Assessment and Goals of Care: I met today with patient's daughter and son.   We discussed clinical course as well as wishes moving forward in regard to advanced directives.  Concepts specific to code status and rehospitalization discussed.  We discussed difference between a aggressive medical intervention path and a palliative, comfort focused care path.  Values and goals of care important to patient and family were attempted to be elicited.  We discussed pathways forward including surgical intervention, continuation of abx and local wound care, and refocusing to a strict comfort care approach.  We also discussed that regardless of pathway forward, Amelia Jo is not going to be a wound that is likely to ever heal.  Concept of Hospice and Palliative Care were discussed.  Questions and concerns addressed.   PMT will continue to support holistically.  SUMMARY OF RECOMMENDATIONS   - DNR/DNI - Family reports that they do not want to pursue surgical interventions.  - Plan to continue current care for another 24 hours to give family time to discuss options.  They are not committed to home hospice, but are strongly considering home hospice (either now or following current course of abx).  They would like to discuss with hospice agency before making decision.  Biggest concern is having help in the  home. They have been hiring help but report that this has not been working out (her children report not knowing she had a wound until it was incredibly advanced). Referral placed to care management to discuss home hospice options. - Plan for f/u meeting again tomorrow morning.  Code Status/Advance Care Planning:  DNR   Symptom Management:   Pain: Continue dilaudid as needed.  Palliative Prophylaxis:   Bowel Regimen and Frequent Pain Assessment  Prognosis:   < 6 months  Discharge Planning: To Be Determined      Primary Diagnoses: Present on Admission: . DNR no code (do not resuscitate) . HTN (hypertension) . Hypothyroidism . Decubital ulcer . Cellulitis . Hypotension . Acute encephalopathy   I have reviewed the medical record, interviewed the patient and family, and examined the patient. The following aspects are pertinent.  Past Medical History:  Diagnosis Date  . Anxiety   . Breast cancer ([place])   . Congestive heart failure, unspecified   . Decubital ulcer   . Diabetes mellitus without complication ([place])   . Essential hypertension, malignant   . Osteoarthrosis, unspecified whether generalized or localized, unspecified site   . Unspecified hypothyroidism   . Unspecified vitamin D deficiency   . Urinary frequency  Social History   Social History  . Marital status: Widowed    Spouse name: N/A  . Number of children: N/A  . Years of education: N/A   Social History Main Topics  . Smoking status: Former Research scientist (life sciences)  . Smokeless tobacco: Never Used  . Alcohol use No  . Drug use: No  . Sexual activity: Not Asked   Other Topics Concern  . None   Social History Narrative  . None   History reviewed. No pertinent family history. Scheduled Meds: . chlorhexidine  15 mL Mouth Rinse BID  . feeding supplement (ENSURE ENLIVE)  237 mL Oral BID BM  . furosemide  20 mg Oral Daily  . mouth rinse  15 mL Mouth Rinse q12n4p  . piperacillin-tazobactam (ZOSYN)  IV  3.375  g Intravenous Q8H  . potassium chloride  60 mEq Oral Once  . sodium hypochlorite   Irrigation BID  . [START ON 06/19/2016] vancomycin  1,000 mg Intravenous Q48H   Continuous Infusions: PRN Meds:.acetaminophen **OR** acetaminophen, HYDROmorphone (DILAUDID) injection, oxyCODONE-acetaminophen, promethazine Medications Prior to Admission:  Prior to Admission medications   Medication Sig Start Date End Date Taking? Authorizing Provider  carvedilol (COREG) 3.125 MG tablet Take 3.125 mg by mouth 2 (two) times daily with a meal.    Yes Historical Provider, MD  gabapentin (NEURONTIN) 100 MG capsule Take 100 mg by mouth at bedtime.   Yes Historical Provider, MD  hydrOXYzine (ATARAX/VISTARIL) 10 MG tablet Take 10 mg by mouth 3 (three) times daily as needed for itching.   Yes Historical Provider, MD  levothyroxine (SYNTHROID, LEVOTHROID) 88 MCG tablet Take 88 mcg by mouth daily.     Yes Historical Provider, MD  naproxen (NAPROSYN) 375 MG tablet Take 1 tablet (375 mg total) by mouth 2 (two) times daily. Patient taking differently: Take 375 mg by mouth 2 (two) times daily as needed.  06/01/12  Yes [name], MD  spironolactone (ALDACTONE) 25 MG tablet Take 25 mg by mouth daily.     Yes Historical Provider, MD  ALPRAZolam Duanne Moron) 0.5 MG tablet Take 0.5 mg by mouth 2 (two) times daily as needed for anxiety.     Historical Provider, MD  cholecalciferol (VITAMIN D) 1000 UNITS tablet Take 1,000 Units by mouth daily.    Historical Provider, MD  docusate sodium (COLACE) 100 MG capsule Take 1 capsule (100 mg total) by mouth every 12 (twelve) hours. Patient not taking: Reported on 06/17/2016 01/07/16   [name], MD  doxycycline (VIBRAMYCIN) 100 MG capsule Take 1 capsule (100 mg total) by mouth 2 (two) times daily. One po bid x 7 days Patient not taking: Reported on 06/17/2016 05/21/16   Domenic Moras, PA-C  furosemide (LASIX) 20 MG tablet Take 20 mg by mouth daily.    Historical Provider, MD    HYDROcodone-acetaminophen (NORCO/VICODIN) 5-325 MG tablet Take 1 tablet by mouth every 6 (six) hours as needed for moderate pain or severe pain. Patient not taking: Reported on 06/17/2016 01/07/16   [name], MD  metFORMIN (GLUCOPHAGE) 1000 MG tablet Take 1 tablet (1,000 mg total) by mouth 2 (two) times daily with a meal. Patient not taking: Reported on 06/17/2016 03/16/13   [name], MD  PARoxetine (PAXIL) 10 MG tablet Take 10 mg by mouth daily.    Historical Provider, MD  silver sulfADIAZINE (SILVADENE) 1 % cream Apply topically daily. Patient not taking: Reported on 06/17/2016 04/18/14   [name], MD  simvastatin (ZOCOR) 40 MG tablet Take 40 mg  by mouth daily. 04/11/14   Historical Provider, MD  traMADol (ULTRAM) 50 MG tablet Take 1 tablet (50 mg total) by mouth every 12 (twelve) hours as needed for moderate pain or severe pain. Patient not taking: Reported on 06/17/2016 05/21/16   Domenic Moras, PA-C   Allergies  Allergen Reactions  . Codeine Nausea And Vomiting  . Penicillins Other (See Comments)    Has patient had a PCN reaction causing immediate rash, facial/tongue/throat swelling, SOB or lightheadedness with hypotension:unsure Has patient had a PCN reaction causing severe rash involving mucus membranes or skin necrosis:unsure Has patient had a PCN reaction that required hospitalization:unsure Has patient had a PCN reaction occurring within the last 10 years:unsure If all of the above answers are "NO", then may proceed with Cephalosporin use.    Review of Systems Reports pain.  Physical Exam General: Sleepy but arousable Heart: Regular rate and rhythm. No murmur appreciated. Lungs: Good air movement, clear Abdomen: Soft, nontender, nondistended, positive bowel sounds.  Skin: Warm and dry. Wound not examined today  Vital Signs: BP (!) 94/55 (BP Location: Left Arm)   Pulse (!) 131   Temp 97.6 F (36.4 C) (Oral)   Resp 19   Ht _0  (1.6 m)   Wt 51.3 kg (113  lb)   SpO2 99%   BMI 20.02 kg/m  Pain Assessment: PAINAD POSS *See Group Information*: 2-Acceptable,Slightly drowsy, easily aroused Pain Score: Asleep   SpO2: SpO2: 99 % O2 Device:SpO2: 99 % O2 Flow Rate: .   IO: Intake/output summary:  Intake/Output Summary (Last 24 hours) at 06/18/16 2211 Last data filed at 06/18/16 6945  Gross per 24 hour  Intake               50 ml  Output                0 ml  Net               50 ml    LBM: Last BM Date:  (Unknown) Baseline Weight: Weight: 51.3 kg (113 lb) Most recent weight: Weight: 51.3 kg (113 lb)     Palliative Assessment/Data:   Flowsheet Rows   Flowsheet Row Most Recent Value  Intake Tab  Referral Department  Hospitalist  Unit at Time of Referral  ER  Palliative Care Primary Diagnosis  Sepsis/Infectious Disease  Date Notified  06/17/16  Palliative Care Type  New Palliative care  Reason for referral  Clarify Goals of Care  Date of Admission  06/17/16  # of days IP prior to Palliative referral  0  Clinical Assessment  Psychosocial & Spiritual Assessment  Palliative Care Outcomes      Time In: 1400 Time Out: 1520 Time Total: 85 Greater than 50%  of this time was spent counseling and coordinating care related to the above assessment and plan.  Signed by: Micheline Rough, MD   Please contact Palliative Medicine Team phone at [phone] for questions and concerns.  For individual provider: See Shea Evans

## 2016-06-18 NOTE — Consult Note (Signed)
[place] Nurse wound consult note Reason for Consult: sacral pressure injury [place] nurse arrived to see this patient, however it was noted that this patient has been evaluated by surgery and they are awaiting Palliative to establish goals of care. Patient would need aggressive surgical debridement and most likely would not heal even with this.  Nutrition and offloading would have to be maximized and this patient is bedbound.  Palliative to meet with family today.  I have reviewed the record and images taken in ED 2/27 at the time of admission. Discussed case with Hospitalist and bedside nurse. Documentation below based on review of record, will plan to see wound with surgery if needed after GOC established and for any needs for additional wound care orders. Wound type: Unstageable Pressure injury Pressure Injury POA: Yes Measurement: see nursing flow sheet Wound bed: 100% non viable tissue Drainage (amount, consistency, odor) moderate, purulent per nursing notes Periwound: intact  Dressing procedure/placement/frequency: Will order chemical debridement (1/4% Dakins) that will also aid in odor control.  This is a good option for palliative wound care should the family decide to go that route.  If decision is made to treat aggressively would need surgical debridement.  [place] nurse will follow along with you for support with wound care as needed. Levander Katzenstein [facility] MSN, [place], [place], [place]

## 2016-06-18 NOTE — Progress Notes (Signed)
Meeting rescheduled to [place] per family request.  Micheline Rough, MD [place] Team [phone]

## 2016-06-19 DIAGNOSIS — I10 Essential (primary) hypertension: Secondary | ICD-10-CM

## 2016-06-19 DIAGNOSIS — Z515 Encounter for palliative care: Secondary | ICD-10-CM

## 2016-06-19 DIAGNOSIS — A419 Sepsis, unspecified organism: Principal | ICD-10-CM

## 2016-06-19 DIAGNOSIS — Z7189 Other specified counseling: Secondary | ICD-10-CM

## 2016-06-19 LAB — BASIC METABOLIC PANEL
ANION GAP: 10 (ref 5–15)
BUN: 8 mg/dL (ref 6–20)
CALCIUM: 8.4 mg/dL — AB (ref 8.9–10.3)
CHLORIDE: 112 mmol/L — AB (ref 101–111)
CO2: 21 mmol/L — AB (ref 22–32)
Creatinine, Ser: 0.59 mg/dL (ref 0.44–1.00)
GFR calc non Af Amer: >60 mL/min (ref >60)
Glucose, Bld: 75 mg/dL (ref 65–99)
Potassium: 2.8 mmol/L — ABNORMAL LOW (ref 3.5–5.1)
Sodium: 143 mmol/L (ref 135–145)

## 2016-06-19 LAB — CBC
HCT: 36.8 % (ref 36.0–46.0)
HEMOGLOBIN: 11.5 g/dL — AB (ref 12.0–15.0)
MCH: 30.4 pg (ref 26.0–34.0)
MCHC: 31.3 g/dL (ref 30.0–36.0)
MCV: 97.4 fL (ref 78.0–100.0)
Platelets: 256 10*3/uL (ref 150–400)
RBC: 3.78 MIL/uL — AB (ref 3.87–5.11)
RDW: 16.6 % — ABNORMAL HIGH (ref 11.5–15.5)
WBC: 10.1 10*3/uL (ref 4.0–10.5)

## 2016-06-19 LAB — LACTIC ACID, PLASMA: Lactic Acid, Venous: 1.6 mmol/L (ref 0.5–1.9)

## 2016-06-19 LAB — HEMATOLOGY COMMENTS:

## 2016-06-19 MED ORDER — POTASSIUM CHLORIDE 20 MEQ PO PACK
40.0000 meq | PACK | Freq: Two times a day (BID) | ORAL | Status: AC
  Administered 2016-06-19: 40 meq via ORAL
  Filled 2016-06-19 (×2): qty 2

## 2016-06-19 NOTE — Final Consult Note (Signed)
Consultant Final Sign-Off Note    Assessment/Final recommendations  [name] is a 83 y.o. female followed by me for Sacral decubitus ulcer  Plan - Palliative care discussed plan with family and they do not want to pursue surgical intervention for this wound.   Wound care (if applicable): Wet to dry dressing changes +/- hydrotherapy    Diet at discharge: per primary team   Activity at discharge: per primary team   Follow-up appointment:  As needed at our office if family decides on surgery as outpatient    Pending results:  [facility]     Ordered   06/17/16 1506  Folate RBC  Add-on,   R     06/17/16 1506       Medication recommendations:   Other recommendations:    Thank you for allowing [place] to participate in the care of your patient!  Please consult [place] again if you have further needs for your patient.  Kalman Drape 06/19/2016 8:22 AM    Subjective   Pt states she is feeling good. No new complaints.   Objective  Vital signs in last 24 hours: Temp:  [97.6 F (36.4 C)] 97.6 F (36.4 C) (02/28 1356) Pulse Rate:  [76-131] 102 (03/01 0618) Resp:  [17-19] 18 (03/01 0618) BP: (83-94)/(55-59) 93/59 (03/01 0618) SpO2:  [99 %-100 %] 100 % (03/01 0618) Weight:  [113 lb (51.3 kg)] 113 lb (51.3 kg) (02/28 1200)  General: Physical Exam  Constitutional: No distress.  Appears to be resting comfortable  Cardiovascular: Normal rate and regular rhythm.   Pulmonary/Chest: Effort normal. No respiratory distress.  Abdominal: Soft. Bowel sounds are normal. She exhibits no distension. There is no tenderness.  Skin: Skin is warm and dry. She is not diaphoretic.  Nursing note and vitals reviewed.   Pertinent labs and Studies:  Recent Labs  06/17/16 1212 06/18/16 0602 06/19/16 0012  WBC 10.8* 10.7* 10.1  HGB 12.6 11.2* 11.5*  HCT 39.2 35.5* 36.8   BMET  Recent Labs  06/18/16 0602 06/19/16 0012  NA 142 143  K 3.2* 2.8*  CL 111 112*  CO2 21*  21*  GLUCOSE 77 75  BUN 10 8  CREATININE 0.54 0.59  CALCIUM 8.2* 8.4*   No results for input(s): LABURIN in the last 72 hours. Results for orders placed or performed during the hospital encounter of 06/17/16  Culture, blood (x 2)     Status: None (Preliminary result)   Collection Time: 06/17/16 12:15 PM  Result Value Ref Range Status   Specimen Description BLOOD RIGHT ANTECUBITAL  Final   Special Requests BOTTLES DRAWN AEROBIC ONLY  10CC  Final   Culture NO GROWTH 1 DAY  Final   Report Status PENDING  Incomplete  Culture, blood (x 2)     Status: None (Preliminary result)   Collection Time: 06/17/16 12:42 PM  Result Value Ref Range Status   Specimen Description BLOOD RIGHT HAND  Final   Special Requests IN PEDIATRIC BOTTLE 1CC  Final   Culture NO GROWTH 1 DAY  Final   Report Status PENDING  Incomplete    Imaging: No results found.   Jackson Latino, [facility] Surgery Pager [phone]

## 2016-06-19 NOTE — Progress Notes (Signed)
CM received consult:Family is interested in talking with hospice agency about care that can be given with home hospice. Please discuss options for home hospice with family and make referral as appropriate. CM spoke with pt's son and daughter @ bedside. Both are interested in being informed / educated on home hospice. Information and choice provide  per CM. CM to f/u with  son/daughter after today's meeting with palliative MD regarding selection for home hospice referral. Whitman Hero RN,BSN,CM

## 2016-06-19 NOTE — Consult Note (Signed)
[place] nurse follow up this am, patient's family does not want surgical intervention and is trying to decide on home Hospice care.  I will leave orders as is. This will clean up wound very slowly, but more importantly control odor at end of life.   Re consult if needed, will not follow at this time. Thanks  Alfonzia Woolum [employer], RN,CWOCN, CNS [phone])

## 2016-06-19 NOTE — Progress Notes (Addendum)
Daily Progress Note   Patient Name: [name]       Date: 06/19/2016 DOB: 08/13/1915  Age: 83 y.o. MRN#: [id number] Attending Physician: Leah Hazel, MD Primary Care Physician: Leah Blocker, MD Admit Date: 06/17/2016  Reason for Consultation/Follow-up: Establishing goals of care  Subjective: I met again with patient's daughter, [name], and son, [name] in conjunction with [name] from hospice palliative care [name].  We discussed again her clinical course including the fact that she continues to be largely unresponsive, is not eating and drinking, and her only periods of wakefulness are whenever she is in severe pain. Cussed with family that when I checked on their mother earlier today, the only thing that she would say to me was "Oh God, Oh God, Oh God. Please help me."  She nodded when I asked if she was in distress from pain. I followed up after administration of her rescue dose of Dilaudid that she was resting comfortably.  We talked about options moving forward (family has elected not to pursue surgical intervention) including continuation of current therapy, discharge home with support of hospice, or transition to [facility] to focus on comfort and end-of-life care.  While family would like to take her home, it is not realistic with her overall situation.  She has a giant wound that requires significant care and her family is unable to provide the care she needs.  We discussed residential hospice and a plan to discontinue current antibiotics, focus on comfort care, transition to [name], and provide aggressive symptom management as she approaches end-of-life. We discussed prognosis of less than 2 weeks.  Length of Stay: 2  Current Medications: Scheduled Meds:  .  chlorhexidine  15 mL Mouth Rinse BID  . feeding supplement (ENSURE ENLIVE)  237 mL Oral BID BM  . furosemide  20 mg Oral Daily  . mouth rinse  15 mL Mouth Rinse q12n4p  . piperacillin-tazobactam (ZOSYN)  IV  3.375 g Intravenous Q8H  . potassium chloride  40 mEq Oral BID  . sodium hypochlorite   Irrigation BID  . vancomycin  1,000 mg Intravenous Q48H    Continuous Infusions:   PRN Meds: acetaminophen **OR** acetaminophen, HYDROmorphone (DILAUDID) injection, oxyCODONE-acetaminophen, promethazine  Physical Exam         General: Sleepy Heart: Regular rate  and rhythm. No murmur appreciated. Lungs: Good air movement, clear Abdomen: Soft, nontender, nondistended, positive bowel sounds.  Skin: Warm and dry. Wound not examined today  Vital Signs: BP (!) 95/52 (BP Location: Left Arm)   Pulse 60   Temp 97.5 F (36.4 C) (Oral)   Resp 16   Ht 5' 3"  (1.6 m)   Wt 51.3 kg (113 lb)   SpO2 100%   BMI 20.02 kg/m  SpO2: SpO2: 100 % O2 Device: O2 Device: Nasal Cannula O2 Flow Rate: O2 Flow Rate (L/min): 2 L/min  Intake/output summary:  Intake/Output Summary (Last 24 hours) at 06/19/16 1630 Last data filed at 06/19/16 1629  Gross per 24 hour  Intake              350 ml  Output                0 ml  Net              350 ml   LBM: Last BM Date:  (pta) Baseline Weight: Weight: 51.3 kg (113 lb) Most recent weight: Weight: 51.3 kg (113 lb)       Palliative Assessment/Data:    Flowsheet Rows   Flowsheet Row Most Recent Value  Intake Tab  Referral Department  Hospitalist  Unit at Time of Referral  ER  Palliative Care Primary Diagnosis  Sepsis/Infectious Disease  Date Notified  06/17/16  Palliative Care Type  New Palliative care  Reason for referral  Clarify Goals of Care  Date of Admission  06/17/16  # of days IP prior to Palliative referral  0  Clinical Assessment  Psychosocial & Spiritual Assessment  Palliative Care Outcomes      Patient Active Problem List   Diagnosis  Date Noted  . Sepsis ([place]) 06/17/2016  . Decubital ulcer 06/17/2016  . Cellulitis 06/17/2016  . Hypotension 06/17/2016  . Acute encephalopathy 06/17/2016  . Decubitus ulcer of sacral region, unstageable ([name])   . SIRS (systemic inflammatory response syndrome) (HCC)   . Weakness   . Fall at home 03/15/2013  . Hip pain, acute 03/15/2013  . Rhabdomyolysis 03/15/2013  . HTN (hypertension) 03/15/2013  . History of CHF (congestive heart failure) 03/15/2013  . Hypothyroidism 03/15/2013  . Breast mass, left 03/15/2013  . DNR no code (do not resuscitate) 03/15/2013    Palliative Care Assessment & Plan   Patient Profile: 83 y.o.femalewith medical history significant forhypothyroidism, hypertension, diabetes, chronic congestive heart failure, breast cancer admitted with sepsis likely related to a large decubitus in sacral area.  Palliative consulted for goals of care.  Recommendations/Plan:  Discontinue antibiotics.  Continue other current care until d/c to [name].  Family would like to pursue placement at [name] for end of life care.  Her pain medication should not be held due to concern for side effects or hypotension.  Code Status:    Code Status Orders        Start     Ordered   06/17/16 1418  Do not attempt resuscitation (DNR)  Continuous    Question Answer Comment  In the event of cardiac or respiratory ARREST Do not call a "code blue"   In the event of cardiac or respiratory ARREST Do not perform Intubation, CPR, defibrillation or ACLS   In the event of cardiac or respiratory ARREST Use medication by any route, position, wound care, and other measures to relive pain and suffering. May use oxygen, suction and manual treatment of airway obstruction as  needed for comfort.      06/17/16 1419    Code Status History    Date Active Date Inactive Code Status Order ID Comments User Context   06/17/2016 11:50 AM 06/17/2016  2:19 PM DNR [id number]  Leah Schmidt, MD ED     03/15/2013  4:38 AM 03/16/2013  7:03 PM DNR [id number]  Leah Falconer, MD Inpatient    Advance Directive Documentation   Flowsheet Row Most Recent Value  Type of Advance Directive  Healthcare Power of Attorney  Pre-existing out of facility DNR order (yellow form or pink MOST form)  No data  "MOST" Form in Place?  No data       Prognosis:   < 2 weeks.  [name] is largely somnolent unless she is in pain.  She is not longer taking adequate nutrition and hydration.  She has a large wound that is likely cause of sepsis.  With focus on comfort, I anticipate she will continue to decline quickly and her prognosis will be less than 2 weeks.  Discharge Planning:  Hospice facility  Care plan was discussed with daughter, [name] and son, [name].  Also discussed with care management as well as [name] from hospice and palliative care [place].  Thank you for allowing the Palliative Medicine Team to assist in the care of this patient.   Time In 1520 Time Out 1630 Total Time 70 Prolonged Time Billed  yes       Greater than 50%  of this time was spent counseling and coordinating care related to the above assessment and plan.  Micheline Rough, MD  Please contact Palliative Medicine Team phone at [phone] for questions and concerns.

## 2016-06-19 NOTE — ED Provider Notes (Signed)
[facility] DEPT Provider Note   CSN: [id number] Arrival date & time: 06/17/16  1108     History   Chief Complaint Chief Complaint  Patient presents with  . Fatigue  . Failure To Thrive  . Skin Ulcer     L5 caveat: Altered mental status  HPI [name] is a 83 y.o. female.  HPI Patient presents the emergency department increasing confusion and decreased oral intake over the past several days with increased generalized weakness.  She has a known large decubitus sacral ulcer which family states is worsened and has a foul smell.  She was seen in emergency department for this on February 1 and it was recommended that IV antibiotics.  Initiated and should be admitted the hospital.  She and her family preferred to go home with oral antibiotics.  She now presents with worsening symptoms.  No vomiting.  No diarrhea.  Chills without documented fever.  Family understands her advanced age and does wish to have IV antibiotics and fluids but does not want intubation or ventilator support.  They do not wish CPR if she were to have cardiac arrest.  They do not want IV pressors.    Past Medical History:  Diagnosis Date  . Anxiety   . Breast cancer ([place])   . Congestive heart failure, unspecified   . Decubital ulcer   . Diabetes mellitus without complication ([place])   . Essential hypertension, malignant   . Osteoarthrosis, unspecified whether generalized or localized, unspecified site   . Unspecified hypothyroidism   . Unspecified vitamin D deficiency   . Urinary frequency     Patient Active Problem List   Diagnosis Date Noted  . Sepsis ([place]) 06/17/2016  . Decubital ulcer 06/17/2016  . Cellulitis 06/17/2016  . Hypotension 06/17/2016  . Acute encephalopathy 06/17/2016  . Decubitus ulcer of sacral region, unstageable ([place])   . SIRS (systemic inflammatory response syndrome) (HCC)   . Weakness   . Fall at home 03/15/2013  . Hip pain, acute 03/15/2013  . Rhabdomyolysis 03/15/2013   . HTN (hypertension) 03/15/2013  . History of CHF (congestive heart failure) 03/15/2013  . Hypothyroidism 03/15/2013  . Breast mass, left 03/15/2013  . DNR no code (do not resuscitate) 03/15/2013    Past Surgical History:  Procedure Laterality Date  . ABDOMINAL HYSTERECTOMY    . BREAST LUMPECTOMY    . BREAST LUMPECTOMY    . CATARACT EXTRACTION    . CHOLECYSTECTOMY      OB History    No data available       Home Medications    Prior to Admission medications   Medication Sig Start Date End Date Taking? Authorizing Provider  carvedilol (COREG) 3.125 MG tablet Take 3.125 mg by mouth 2 (two) times daily with a meal.    Yes Historical Provider, MD  gabapentin (NEURONTIN) 100 MG capsule Take 100 mg by mouth at bedtime.   Yes Historical Provider, MD  hydrOXYzine (ATARAX/VISTARIL) 10 MG tablet Take 10 mg by mouth 3 (three) times daily as needed for itching.   Yes Historical Provider, MD  levothyroxine (SYNTHROID, LEVOTHROID) 88 MCG tablet Take 88 mcg by mouth daily.     Yes Historical Provider, MD  naproxen (NAPROSYN) 375 MG tablet Take 1 tablet (375 mg total) by mouth 2 (two) times daily. Patient taking differently: Take 375 mg by mouth 2 (two) times daily as needed.  06/01/12  Yes [name], MD  spironolactone (ALDACTONE) 25 MG tablet Take 25 mg by mouth  daily.     Yes Historical Provider, MD  ALPRAZolam Duanne Moron) 0.5 MG tablet Take 0.5 mg by mouth 2 (two) times daily as needed for anxiety.     Historical Provider, MD  cholecalciferol (VITAMIN D) 1000 UNITS tablet Take 1,000 Units by mouth daily.    Historical Provider, MD  docusate sodium (COLACE) 100 MG capsule Take 1 capsule (100 mg total) by mouth every 12 (twelve) hours. Patient not taking: Reported on 06/17/2016 01/07/16   [name], MD  doxycycline (VIBRAMYCIN) 100 MG capsule Take 1 capsule (100 mg total) by mouth 2 (two) times daily. One po bid x 7 days Patient not taking: Reported on 06/17/2016 05/21/16   Domenic Moras,  PA-C  furosemide (LASIX) 20 MG tablet Take 20 mg by mouth daily.    Historical Provider, MD  HYDROcodone-acetaminophen (NORCO/VICODIN) 5-325 MG tablet Take 1 tablet by mouth every 6 (six) hours as needed for moderate pain or severe pain. Patient not taking: Reported on 06/17/2016 01/07/16   [name], MD  metFORMIN (GLUCOPHAGE) 1000 MG tablet Take 1 tablet (1,000 mg total) by mouth 2 (two) times daily with a meal. Patient not taking: Reported on 06/17/2016 03/16/13   [name], MD  PARoxetine (PAXIL) 10 MG tablet Take 10 mg by mouth daily.    Historical Provider, MD  silver sulfADIAZINE (SILVADENE) 1 % cream Apply topically daily. Patient not taking: Reported on 06/17/2016 04/18/14   [name], MD  simvastatin (ZOCOR) 40 MG tablet Take 40 mg by mouth daily. 04/11/14   Historical Provider, MD  traMADol (ULTRAM) 50 MG tablet Take 1 tablet (50 mg total) by mouth every 12 (twelve) hours as needed for moderate pain or severe pain. Patient not taking: Reported on 06/17/2016 05/21/16   Domenic Moras, PA-C    Family History History reviewed. No pertinent family history.  Social History Social History  Substance Use Topics  . Smoking status: Former Research scientist (life sciences)  . Smokeless tobacco: Never Used  . Alcohol use No     Allergies   Codeine and Penicillins   Review of Systems Review of Systems  Unable to perform ROS: Mental status change     Physical Exam Updated Vital Signs BP (!) 93/59 (BP Location: Left Arm)   Pulse (!) 102   Temp 97.6 F (36.4 C) (Oral)   Resp 18   Ht 5\' 3"  (1.6 m)   Wt 113 lb (51.3 kg)   SpO2 100%   BMI 20.02 kg/m   Physical Exam  Constitutional: She is oriented to person, place, and time. She appears well-developed and well-nourished.  HENT:  Head: Normocephalic and atraumatic.  Eyes: EOM are normal.  Neck: Normal range of motion.  Cardiovascular:  Tachycardia  Pulmonary/Chest: Effort normal and breath sounds normal. No respiratory distress.    Abdominal: Soft. She exhibits no distension. There is no tenderness.  Musculoskeletal:  Full range of motion bilateral hips.  Large necrotic foul-smelling decubitus sacral ulcer with extensive soft tissue loss and purulent material  Neurological: She is alert and oriented to person, place, and time.  Psychiatric: She has a normal mood and affect.  Nursing note and vitals reviewed.    ED Treatments / Results  Labs (all labs ordered are listed, but only abnormal results are displayed) Labs Reviewed  CBC WITH DIFFERENTIAL/PLATELET - Abnormal; Notable for the following:       Result Value   WBC 10.8 (*)    RDW 16.1 (*)    Neutro Abs 9.3 (*)  All other components within normal limits  COMPREHENSIVE METABOLIC PANEL - Abnormal; Notable for the following:    Total Protein 5.7 (*)    Albumin 2.1 (*)    Alkaline Phosphatase 136 (*)    Total Bilirubin 1.4 (*)    All other components within normal limits  LACTIC ACID, PLASMA - Abnormal; Notable for the following:    Lactic Acid, Venous 2.0 (*)    All other components within normal limits  LACTIC ACID, PLASMA - Abnormal; Notable for the following:    Lactic Acid, Venous 2.9 (*)    All other components within normal limits  BASIC METABOLIC PANEL - Abnormal; Notable for the following:    Potassium 3.2 (*)    CO2 21 (*)    Calcium 8.2 (*)    All other components within normal limits  CBC - Abnormal; Notable for the following:    WBC 10.7 (*)    RBC 3.67 (*)    Hemoglobin 11.2 (*)    HCT 35.5 (*)    RDW 16.4 (*)    All other components within normal limits  TROPONIN I - Abnormal; Notable for the following:    Troponin I 0.07 (*)    All other components within normal limits  LACTIC ACID, PLASMA - Abnormal; Notable for the following:    Lactic Acid, Venous 2.0 (*)    All other components within normal limits  LACTIC ACID, PLASMA - Abnormal; Notable for the following:    Lactic Acid, Venous 2.1 (*)    All other components within  normal limits  BASIC METABOLIC PANEL - Abnormal; Notable for the following:    Potassium 2.8 (*)    Chloride 112 (*)    CO2 21 (*)    Calcium 8.4 (*)    All other components within normal limits  CBC - Abnormal; Notable for the following:    RBC 3.78 (*)    Hemoglobin 11.5 (*)    RDW 16.6 (*)    All other components within normal limits  CULTURE, BLOOD (ROUTINE X 2)  CULTURE, BLOOD (ROUTINE X 2)  PROCALCITONIN  PROTIME-INR  APTT  VITAMIN B12  LACTIC ACID, PLASMA  FOLATE RBC    EKG  EKG Interpretation  Date/Time:  Tuesday June 17 2016 11:20:21 EST Ventricular Rate:  162 PR Interval:    QRS Duration: 131 QT Interval:  336 QTC Calculation: 552 R Axis:   -133 Text Interpretation:  Wide-QRS tachycardia Nonspecific intraventricular conduction delay SINCE LAST TRACING HEART RATE HAS INCREASED Confirmed by Winfred Leeds  MD, SAM [phone]) on 06/18/2016 5:05:58 PM       Radiology Dg Chest 1 View  Result Date: 06/17/2016 CLINICAL DATA:  Sacral decubitus ulcer.  Evaluation for sepsis. EXAM: CHEST 1 VIEW COMPARISON:  Chest radiograph 03/15/2013 FINDINGS: The heart size and mediastinal contours are within normal limits. Both lungs are clear. The visualized skeletal structures are unremarkable. IMPRESSION: No active disease. Electronically Signed   By: Ulyses Jarred M.D.   On: 06/17/2016 13:30   Dg Sacrum/coccyx  Result Date: 06/17/2016 CLINICAL DATA:  Sacral decubitus ulcer with infection and sepsis. EXAM: SACRUM AND COCCYX - 2+ VIEW COMPARISON:  CT scan 03/15/2013 FINDINGS: Extensive calcified injection granulomas noted in the like regions bilaterally the. Severe chronic right hip disease. The pubic symphysis and SI joints are grossly normal. The sacrum is grossly intact. Air noted in the soft tissues from the decubitus ulcer extending very close down to the sacrum. Moderate stool noted in the rectum.  IMPRESSION: Deep decubitus ulcer extending down close to the sacrum. No obvious  destructive bony changes to suggest osteomyelitis by plain films. Severe right hip joint degenerative changes. Numerous calcified injection granulomas bilaterally. Electronically Signed   By: Marijo Sanes M.D.   On: 06/17/2016 13:27    Procedures Procedures (including critical care time)  Medications Ordered in ED Medications  piperacillin-tazobactam (ZOSYN) IVPB 3.375 g (3.375 g Intravenous Given 06/19/16 0446)  vancomycin (VANCOCIN) IVPB 1000 mg/200 mL premix (not administered)  furosemide (LASIX) tablet 20 mg (20 mg Oral Not Given 06/18/16 1000)  0.9 %  sodium chloride infusion ( Intravenous Stopped 06/18/16 1331)  acetaminophen (TYLENOL) tablet 650 mg (not administered)    Or  acetaminophen (TYLENOL) suppository 650 mg (not administered)  promethazine (PHENERGAN) injection 6.25 mg (not administered)  oxyCODONE-acetaminophen (PERCOCET/ROXICET) 5-325 MG per tablet 1-2 tablet (not administered)  HYDROmorphone (DILAUDID) injection 0.5 mg (0.5 mg Intravenous Given 06/19/16 0105)  feeding supplement (ENSURE ENLIVE) (ENSURE ENLIVE) liquid 237 mL (237 mLs Oral Not Given 06/18/16 1500)  sodium hypochlorite (DAKIN'S 1/4 STRENGTH) topical solution ( Irrigation Given 06/18/16 2143)  chlorhexidine (PERIDEX) 0.12 % solution 15 mL (15 mLs Mouth Rinse Given 06/18/16 2143)  MEDLINE mouth rinse (15 mLs Mouth Rinse Not Given 06/18/16 1844)  potassium chloride (KLOR-CON) packet 40 mEq (not administered)  sodium chloride 0.9 % bolus 1,000 mL (0 mLs Intravenous Stopped 06/17/16 1526)    And  sodium chloride 0.9 % bolus 500 mL (0 mLs Intravenous Stopped 06/17/16 1526)    And  sodium chloride 0.9 % bolus 250 mL (0 mLs Intravenous Stopped 06/17/16 1526)  piperacillin-tazobactam (ZOSYN) IVPB 3.375 g (0 g Intravenous Stopped 06/17/16 1526)  vancomycin (VANCOCIN) IVPB 1000 mg/200 mL premix (0 mg Intravenous Stopped 06/17/16 1526)  morphine 4 MG/ML injection 4 mg (4 mg Intravenous Given 06/17/16 1249)  sodium chloride 0.9 %  bolus 500 mL (500 mLs Intravenous New Bag/Given 06/17/16 1545)  sodium chloride 0.9 % bolus 250 mL (250 mLs Intravenous Given 06/17/16 2008)  sodium chloride 0.9 % bolus 500 mL (500 mLs Intravenous Given 06/17/16 2201)  sodium chloride 0.9 % bolus 250 mL (250 mLs Intravenous Given 06/18/16 0047)     Initial Impression / Assessment and Plan / ED Course  I have reviewed the triage vital signs and the nursing notes.  Pertinent labs & imaging results that were available during my care of the patient were reviewed by me and considered in my medical decision making (see chart for details).     Patient with large complex necrotic decubitus sacral ulcer that is likely infected.  The patient has sepsis.  Family wishes supportive care with fluids and IV antibiotics but no more aggressive management secondary to her advanced age.  They're reasonable and there wishes.  They understand the severity of her illness.  Patient be admitted to the hospitalist service.  Final Clinical Impressions(s) / ED Diagnoses   Final diagnoses:  Sepsis ([place])  Sepsis, due to unspecified organism ([place])  Sacral decubitus ulcer, stage IV [facility])    New Prescriptions Current Discharge Medication List       Jola Schmidt, MD 06/19/16 [phone]

## 2016-06-19 NOTE — Progress Notes (Addendum)
[facility] Liaison: RN visit  Received request from [employer], CMRN/[place], [place] for family interest in [facility] with request for transfer 06/20/16.  Chart currently being reviewed.  There are currently no beds available at [facility].  Met with family and Dr. Domingo Cocking to confirm interest and explain services.  Family agreeable to transfer tomorrow, if there is bed availability and patient is approved.  Will continue to follow.   Thank you for the referral.  [name], RN, [facility] Liaison [phone]  All hospital liaison's are now on [place].

## 2016-06-19 NOTE — Progress Notes (Signed)
Per palliative MD daughter [name] selected [facility] home hospice referral. CM confirmed with daughter and son [name]). [facility] referral made to [name] [facility] liaison) @ [phone]. Whitman Hero RN,BSN,CM [phone]

## 2016-06-19 NOTE — Progress Notes (Signed)
PROGRESS NOTE    [name]  [id number] DOB: [birth year] DOA: 06/17/2016 PCP: Rogers Blocker, MD    Brief Narrative:  83 y.o. female with medical history significant for hypothyroidism, hypertension, diabetes, chronic congestive heart failure, breast cancer sensitivity emergency Department chief complaint of generalized weakness. Initial evaluation concerning for sepsis likely related to a large decubitus in sacral area  Information is obtained from the chart. Patient was brought to the emergency department approximately 3 weeks ago with the chief complaint of worsening decubitus ulcer generalized weakness nausea without vomiting. She is provided with oral antibiotics and discharged to home. Today she was transported to the emergency department via EMS from home with reports of worsening lethargy increased weakness decrease urine output. No reports of any nausea vomiting diarrhea constipation. No reports of fever chills. Patient denies pain shortness of breath.  Assessment & Plan:   Principal Problem:   Decubital ulcer Active Problems:   HTN (hypertension)   History of CHF (congestive heart failure)   Hypothyroidism   DNR no code (do not resuscitate)   Cellulitis   Hypotension   Acute encephalopathy   Decubitus ulcer of sacral region, unstageable (HCC)   SIRS (systemic inflammatory response syndrome) (HCC)   Weakness   #1. Decubitus ulcer/cellulitis. - Pt presented with chronic non-healing sacral decub ulcer with concerns for developing cellulitis - Pt had been on empiric IV abx per below - General Surgery was consulted. Given plans for more conservative management and focus on comfort and supportive measures. No plans for surgery - Surgery and WOC have signed off  #2. Generalized weakness/acute encephalopathy.  - Likely toxicmetabolic encephalopathy secondary to sacral decub -Patient has been continued on abx per above - Appreciate input by Palliative Care. Plans for  hospice. Will follow up on palliative care recs  3. Hypotension.  - Home medications include Coreg, Lasix, spironolactone - Continuing to hold bp meds for the time being secondary to hypotension at presentation - bp stable at present  4. history of CHF - appears dehydrated at this time -holding diuretics   DVT prophylaxis: SCD's Code Status: DNR Family Communication: Pt in room, family at bedside Disposition Plan: Uncertain at this time  Consultants:   Palliative Care  General Surgery  Procedures:     Antimicrobials: Anti-infectives    Start     Dose/Rate Route Frequency Ordered Stop   06/19/16 1230  vancomycin (VANCOCIN) IVPB 1000 mg/200 mL premix     1,000 mg 200 mL/hr over 60 Minutes Intravenous Every 48 hours 06/17/16 1330     06/17/16 2030  piperacillin-tazobactam (ZOSYN) IVPB 3.375 g     3.375 g 12.5 mL/hr over 240 Minutes Intravenous Every 8 hours 06/17/16 1330     06/17/16 1200  piperacillin-tazobactam (ZOSYN) IVPB 3.375 g     3.375 g 100 mL/hr over 30 Minutes Intravenous  Once 06/17/16 1151 06/17/16 1526   06/17/16 1200  vancomycin (VANCOCIN) IVPB 1000 mg/200 mL premix     1,000 mg 200 mL/hr over 60 Minutes Intravenous  Once 06/17/16 1151 06/17/16 1526      Subjective: Without complaints  Objective: Vitals:   06/18/16 1200 06/18/16 1356 06/18/16 2014 06/19/16 0618  BP:  (!) 83/58 (!) 94/55 (!) 93/59  Pulse:  76 (!) 131 (!) 102  Resp:  17 19 18   Temp:  97.6 F (36.4 C)    TempSrc:  Oral    SpO2:  99%  100%  Weight: 51.3 kg (113 lb)     Height:  5\' 3"  (1.6 m)       Intake/Output Summary (Last 24 hours) at 06/19/16 1424 Last data filed at 06/19/16 0446  Gross per 24 hour  Intake              100 ml  Output                0 ml  Net              100 ml   Filed Weights   06/18/16 1200  Weight: 51.3 kg (113 lb)    Examination:  General exam: Awake, in nad Respiratory system: normal resp effort, no wheezing Cardiovascular system: regular  rate, s1-2 Gastrointestinal system: soft, nondistended Central nervous system: cn2-12 grossly intact, strength intact Extremities: perfused, no clubbing Skin: normal skin turgor, no notable skin lesions seen Psychiatry: mood normal// no visual hallucinations  Data Reviewed: I have personally reviewed following labs and imaging studies  CBC:  Recent Labs Lab 06/17/16 1212 06/18/16 0602 06/19/16 0012  WBC 10.8* 10.7* 10.1  NEUTROABS 9.3*  --   --   HGB 12.6 11.2* 11.5*  HCT 39.2 35.5* 36.8  MCV 96.1 96.7 97.4  PLT 321 [id number]   Basic Metabolic Panel:  Recent Labs Lab 06/17/16 1212 06/18/16 0602 06/19/16 0012  NA 139 142 143  K 3.6 3.2* 2.8*  CL 102 111 112*  CO2 23 21* 21*  GLUCOSE 88 77 75  BUN 17 10 8   CREATININE 0.67 0.54 0.59  CALCIUM 9.2 8.2* 8.4*   GFR: Estimated Creatinine Clearance: 31.8 mL/min (by C-G formula based on SCr of 0.59 mg/dL). Liver Function Tests:  Recent Labs Lab 06/17/16 1212  AST 37  ALT 30  ALKPHOS 136*  BILITOT 1.4*  PROT 5.7*  ALBUMIN 2.1*   No results for input(s): LIPASE, AMYLASE in the last 168 hours. No results for input(s): AMMONIA in the last 168 hours. Coagulation Profile:  Recent Labs Lab 06/17/16 1212  INR 1.15   Cardiac Enzymes:  Recent Labs Lab 06/17/16 1926  TROPONINI 0.07*   BNP (last 3 results) No results for input(s): PROBNP in the last 8760 hours. HbA1C: No results for input(s): HGBA1C in the last 72 hours. CBG: No results for input(s): GLUCAP in the last 168 hours. Lipid Profile: No results for input(s): CHOL, HDL, LDLCALC, TRIG, CHOLHDL, LDLDIRECT in the last 72 hours. Thyroid Function Tests: No results for input(s): TSH, T4TOTAL, FREET4, T3FREE, THYROIDAB in the last 72 hours. Anemia Panel:  Recent Labs  06/17/16 1548  VITAMINB12 723   Sepsis Labs:  Recent Labs Lab 06/17/16 1212 06/17/16 1548 06/17/16 1926 06/17/16 2239 06/19/16 0012  PROCALCITON 0.17  --   --   --   --     LATICACIDVEN 2.0* 2.9* 2.0* 2.1* 1.6    Recent Results (from the past 240 hour(s))  Culture, blood (x 2)     Status: None (Preliminary result)   Collection Time: 06/17/16 12:15 PM  Result Value Ref Range Status   Specimen Description BLOOD RIGHT ANTECUBITAL  Final   Special Requests BOTTLES DRAWN AEROBIC ONLY  10CC  Final   Culture NO GROWTH 2 DAYS  Final   Report Status PENDING  Incomplete  Culture, blood (x 2)     Status: None (Preliminary result)   Collection Time: 06/17/16 12:42 PM  Result Value Ref Range Status   Specimen Description BLOOD RIGHT HAND  Final   Special Requests IN PEDIATRIC BOTTLE [place]  Final   Culture  NO GROWTH 2 DAYS  Final   Report Status PENDING  Incomplete     Radiology Studies: No results found.  Scheduled Meds: . chlorhexidine  15 mL Mouth Rinse BID  . feeding supplement (ENSURE ENLIVE)  237 mL Oral BID BM  . furosemide  20 mg Oral Daily  . mouth rinse  15 mL Mouth Rinse q12n4p  . piperacillin-tazobactam (ZOSYN)  IV  3.375 g Intravenous Q8H  . potassium chloride  40 mEq Oral BID  . sodium hypochlorite   Irrigation BID  . vancomycin  1,000 mg Intravenous Q48H   Continuous Infusions:   LOS: 2 days   Idelia Caudell, Orpah Melter, MD [facility] Pager [phone]  If 7PM-7AM, please contact night-coverage [email or web address] Password [facility] 06/19/2016, 2:24 PM

## 2016-06-19 NOTE — Progress Notes (Signed)
Nutrition Brief Note  Chart reviewed. Pt and family desire no further surgical interventions for wound. Case discussed with RN; pt is refusing all medications and PO's. Palliative care met with family yesterday and is interested in pursuing hospice services. Palliative team to meet with family today to firm up options- per RN, will likely transfer to residential hospice placement.  No further nutrition interventions warranted at this time.  Please re-consult as needed.    A. , RD, LDN, CDE Pager: [phone] After hours Pager: [phone]  

## 2016-06-20 MED ORDER — PROMETHAZINE HCL 25 MG/ML IJ SOLN: 6.2500 mg | Freq: Four times a day (QID) | INTRAMUSCULAR | 0 refills | Status: AC | PRN

## 2016-06-20 MED ORDER — ORAL CARE MOUTH RINSE: 15.0000 mL | Freq: Two times a day (BID) | OROMUCOSAL | 0 refills | Status: AC

## 2016-06-20 MED ORDER — HYDROMORPHONE HCL 2 MG/ML IJ SOLN: 0.5000 mg | INTRAMUSCULAR | 0 refills | Status: AC | PRN

## 2016-06-20 NOTE — Progress Notes (Signed)
Patient will DC to: [facility] Anticipated DC date: 06/20/16 Family notified: Daughter Transport by: Corey Harold 2:30pm   Per MD patient ready for DC to [facility]. RN, patient, patient's family, and facility notified of DC. Discharge Summary sent to facility. RN given number for report. DC packet on chart. Ambulance transport requested for patient.   CSW signing off.  Cedric Fishman, [place] Social Worker [phone]

## 2016-06-20 NOTE — Progress Notes (Signed)
Patient was discharged to [facility] by MD order; discharged instructions review and sent to facility with care notes; patient will be transfered to facility with IV in place per MD order; wound dressing changed before patient was transfered; patient will be transported to facility via [place]. Facility was called and report was given to the nurse who is going to receive the patient.

## 2016-06-20 NOTE — Clinical Social Work Note (Signed)
Clinical Social Work Assessment  Patient Details  Name: [name] MRN: [id number] Date of Birth: [birth year]  Date of referral:  06/20/16               Reason for consult:  End of Life/Hospice                Permission sought to share information with:  Facility Sport and exercise psychologist, Family Supports Permission granted to share information::  No  Name::     Ship broker::  Hospice  Relationship::  Daughter  Contact Information:  [phone]  Housing/Transportation Living arrangements for the past 2 months:  Single Family Home Source of Information:  Other (Comment Required) Patient Interpreter Needed:  None Criminal Activity/Legal Involvement Pertinent to Current Situation/Hospitalization:  No - Comment as needed Significant Relationships:  Adult Children Lives with:  Adult Children Do you feel safe going back to the place where you live?  No Need for family participation in patient care:  Yes (Comment)  Care giving concerns:  CSW received consult regarding Hospice placement. CSW spoke with patient's daughter, [name]. She and her brother would like for patient to discharge to [facility]. CSW will continue following for needs.   Social Worker assessment / plan:  Patient will discharge to [facility] today by PTAR.  Employment status:  Retired Forensic scientist:  Medicare PT Recommendations:  Not assessed at this time Information / Referral to community resources:     Patient/Family's Response to care:  Patient's daughter expressed understanding of discharge process and is glad that patient has a bed available at [facility].   Patient/Family's Understanding of and Emotional Response to Diagnosis, Current Treatment, and Prognosis:  Patient's daughter expressed understanding of CSW role and discharge process. No questions/concerns about plan or treatment.    Emotional Assessment Appearance:  Appears stated age Attitude/Demeanor/Rapport:   Unable to Assess Affect (typically observed):  Unable to Assess Orientation:   (Disoriented) Alcohol / Substance use:  Not Applicable Psych involvement (Current and /or in the community):  No (Comment)  Discharge Needs  Concerns to be addressed:  Care Coordination Readmission within the last 30 days:  No Current discharge risk:  None Barriers to Discharge:  No Barriers Identified   Benard Halsted, [place] 06/20/2016, 11:25 AM

## 2016-06-20 NOTE — Care Management Note (Signed)
Case Management Note  Patient Details  Name: [name] MRN: [id number] Date of Birth: 18-Jan-1916  Subjective/Objective:            Admitted with sepsis/ decubital ulcer.         Fuller Plan (Daughter) Randal Buba (Son)    [phone]      Action/Plan:  Plan is to d/c to residential hospice today.  Expected Discharge Date:    06/20/2016             Expected Discharge Plan:  [place]  In-House Referral:  Clinical Social Work  Discharge planning Services  CM Consult  Status of Service:  Completed, signed off  If discussed at [employer], dates discussed:    Additional Comments:  Sharin Mons, RN 06/20/2016, 11:49 AM

## 2016-06-20 NOTE — Care Management Important Message (Signed)
Important Message  Patient Details  Name: [name] MRN: [id number] Date of Birth: 1915-05-06   Medicare Important Message Given:  Yes    Nathen May 06/20/2016, 1:18 PM

## 2016-06-20 NOTE — Progress Notes (Signed)
Daily Progress Note   Patient Name: [name]       Date: 06/20/2016 DOB: [birth year]  Age: 83 y.o. MRN#: [id number] Attending Physician: Donne Hazel, MD Primary Care Physician: Rogers Blocker, MD Admit Date: 06/17/2016  Reason for Consultation/Follow-up: Establishing goals of care  Subjective: Patient alone in room.  Sleeping on arrival.    She aroused easily and would only say, "Please, Please, Please."  I asked if she was in pain to which she nodded yes, also nodded yes when asked if she needed pain medication.  Length of Stay: 3  Current Medications: Scheduled Meds:  . chlorhexidine  15 mL Mouth Rinse BID  . feeding supplement (ENSURE ENLIVE)  237 mL Oral BID BM  . furosemide  20 mg Oral Daily  . mouth rinse  15 mL Mouth Rinse q12n4p  . sodium hypochlorite   Irrigation BID    Continuous Infusions:   PRN Meds: acetaminophen **OR** acetaminophen, HYDROmorphone (DILAUDID) injection, oxyCODONE-acetaminophen, promethazine  Physical Exam         General: Sleepy Heart: Regular rate and rhythm. No murmur appreciated. Lungs: Good air movement, clear Abdomen: Soft, nontender, nondistended, positive bowel sounds.  Skin: Warm and dry. Wound not examined today  Vital Signs: BP 120/73 (BP Location: Left Arm)   Pulse 91   Temp (!) 96.7 F (35.9 C) (Axillary)   Resp 17   Ht 5' 3"  (1.6 m)   Wt 51.3 kg (113 lb)   SpO2 97%   BMI 20.02 kg/m  SpO2: SpO2: 97 % O2 Device: O2 Device: Nasal Cannula O2 Flow Rate: O2 Flow Rate (L/min): 2.5 L/min  Intake/output summary:   Intake/Output Summary (Last 24 hours) at 06/20/16 1008 Last data filed at 06/19/16 2200  Gross per 24 hour  Intake              310 ml  Output                0 ml  Net              310 ml   LBM: Last BM  Date:  (PTA) Baseline Weight: Weight: 51.3 kg (113 lb) Most recent weight: Weight: 51.3 kg (113 lb)       Palliative Assessment/Data:    Clinical research associate Row  Most Recent Value  Intake Tab  Referral Department  Hospitalist  Unit at Time of Referral  ER  Palliative Care Primary Diagnosis  Sepsis/Infectious Disease  Date Notified  06/17/16  Palliative Care Type  New Palliative care  Reason for referral  Clarify Goals of Care  Date of Admission  06/17/16  # of days IP prior to Palliative referral  0  Clinical Assessment  Psychosocial & Spiritual Assessment  Palliative Care Outcomes      Patient Active Problem List   Diagnosis Date Noted  . Goals of care, counseling/discussion   . Palliative care encounter   . Sepsis ([place]) 06/17/2016  . Decubital ulcer 06/17/2016  . Cellulitis 06/17/2016  . Hypotension 06/17/2016  . Acute encephalopathy 06/17/2016  . Decubitus ulcer of sacral region, unstageable ([place])   . SIRS (systemic inflammatory response syndrome) (HCC)   . Weakness   . Fall at home 03/15/2013  . Hip pain, acute 03/15/2013  . Rhabdomyolysis 03/15/2013  . HTN (hypertension) 03/15/2013  . History of CHF (congestive heart failure) 03/15/2013  . Hypothyroidism 03/15/2013  . Breast mass, left 03/15/2013  . DNR no code (do not resuscitate) 03/15/2013    Palliative Care Assessment & Plan   Patient Profile: 83 y.o.femalewith medical history significant forhypothyroidism, hypertension, diabetes, chronic congestive heart failure, breast cancer admitted with sepsis likely related to a large decubitus in sacral area.  Palliative consulted for goals of care.  Recommendations/Plan:  Discontinue antibiotics.  Continue other current care until d/c to [facility].  Family would like to pursue placement at [facility] for end of life care.  Her pain medication should not be held due to concern for side effects or hypotension.  Code Status:    Code Status  Orders        Start     Ordered   06/17/16 1418  Do not attempt resuscitation (DNR)  Continuous    Question Answer Comment  In the event of cardiac or respiratory ARREST Do not call a "code blue"   In the event of cardiac or respiratory ARREST Do not perform Intubation, CPR, defibrillation or ACLS   In the event of cardiac or respiratory ARREST Use medication by any route, position, wound care, and other measures to relive pain and suffering. May use oxygen, suction and manual treatment of airway obstruction as needed for comfort.      06/17/16 1419    Code Status History    Date Active Date Inactive Code Status Order ID Comments User Context   06/17/2016 11:50 AM 06/17/2016  2:19 PM DNR [id number]  Jola Schmidt, MD ED   03/15/2013  4:38 AM 03/16/2013  7:03 PM DNR [id number]  Orvan Falconer, MD Inpatient    Advance Directive Documentation   Flowsheet Row Most Recent Value  Type of Advance Directive  Healthcare Power of Attorney  Pre-existing out of facility DNR order (yellow form or pink MOST form)  No data  "MOST" Form in Place?  No data       Prognosis:   < 2 weeks.  Ms. Dipierro is largely somnolent unless she is in pain.  She is not longer taking adequate nutrition and hydration.  She has a large wound that is likely cause of sepsis.  With focus on comfort, I anticipate she will continue to decline quickly and her prognosis will be less than 2 weeks.  Discharge Planning:  Hospice facility  Care plan was discussed with daughter, [name] and son, [name].  Also discussed with  care management as well as [name] from hospice and palliative care [place].  Thank you for allowing the Palliative Medicine Team to assist in the care of this patient.   Total Time 70 Prolonged Time Billed  yes       Greater than 50%  of this time was spent counseling and coordinating care related to the above assessment and plan.  Micheline Rough, MD  Please contact Palliative Medicine Team phone at  [phone] for questions and concerns.                                                                                                                                                                                                               Daily Progress Note   Patient Name: [name]       Date: 06/20/2016 DOB: January 23, 1916  Age: 83 y.o. MRN#: [id number] Attending Physician: Donne Hazel, MD Primary Care Physician: Rogers Blocker, MD Admit Date: 06/17/2016  Reason for Consultation/Follow-up: Establishing goals of care  Subjective: Patient alone in room.  Length of Stay: 3  Current Medications: Scheduled Meds:  . chlorhexidine  15 mL Mouth Rinse BID  . feeding supplement (ENSURE ENLIVE)  237 mL Oral BID BM  . furosemide  20 mg Oral Daily  . mouth rinse  15 mL Mouth Rinse q12n4p  . sodium hypochlorite   Irrigation BID    Continuous Infusions:   PRN Meds: acetaminophen **OR** acetaminophen, HYDROmorphone (DILAUDID) injection, oxyCODONE-acetaminophen, promethazine  Physical Exam         General: Sleepy Heart: Regular rate and rhythm. No murmur appreciated. Lungs: Good air movement, clear Abdomen: Soft, nontender, nondistended, positive bowel sounds.  Skin: Warm and dry. Wound not examined today  Vital Signs: BP 120/73 (BP Location: Left Arm)   Pulse 91   Temp (!) 96.7 F (35.9 C) (Axillary)   Resp 17   Ht 5' 3"  (1.6 m)   Wt 51.3 kg (113 lb)   SpO2 97%   BMI 20.02 kg/m  SpO2: SpO2: 97 % O2 Device: O2 Device: Nasal Cannula O2 Flow Rate: O2 Flow Rate (L/min): 2.5 L/min  Intake/output summary:   Intake/Output Summary (Last 24 hours) at 06/20/16 1009 Last data filed at 06/19/16 2200  Gross per 24 hour  Intake              310 ml  Output                0 ml  Net  310 ml   LBM: Last BM Date:  (PTA) Baseline Weight: Weight: 51.3 kg (113 lb) Most recent weight: Weight: 51.3 kg (113 lb)       Palliative Assessment/Data:    Flowsheet Rows    Flowsheet Row Most Recent Value  Intake Tab  Referral Department  Hospitalist  Unit at Time of Referral  ER  Palliative Care Primary Diagnosis  Sepsis/Infectious Disease  Date Notified  06/17/16  Palliative Care Type  New Palliative care  Reason for referral  Clarify Goals of Care  Date of Admission  06/17/16  # of days IP prior to Palliative referral  0  Clinical Assessment  Psychosocial & Spiritual Assessment  Palliative Care Outcomes      Patient Active Problem List   Diagnosis Date Noted  . Goals of care, counseling/discussion   . Palliative care encounter   . Sepsis ([place]) 06/17/2016  . Decubital ulcer 06/17/2016  . Cellulitis 06/17/2016  . Hypotension 06/17/2016  . Acute encephalopathy 06/17/2016  . Decubitus ulcer of sacral region, unstageable ([place])   . SIRS (systemic inflammatory response syndrome) (HCC)   . Weakness   . Fall at home 03/15/2013  . Hip pain, acute 03/15/2013  . Rhabdomyolysis 03/15/2013  . HTN (hypertension) 03/15/2013  . History of CHF (congestive heart failure) 03/15/2013  . Hypothyroidism 03/15/2013  . Breast mass, left 03/15/2013  . DNR no code (do not resuscitate) 03/15/2013    Palliative Care Assessment & Plan   Patient Profile: 82 y.o.femalewith medical history significant forhypothyroidism, hypertension, diabetes, chronic congestive heart failure, breast cancer admitted with sepsis likely related to a large decubitus in sacral area.  Palliative consulted for goals of care.  On 3/1 I met with son [name] and daughter [name].  We discussed residential hospice and a plan to discontinue current antibiotics, focus on comfort care, transition to [facility], and provide aggressive symptom management as she approaches end-of-life. We discussed prognosis of less than 2 weeks.  Recommendations/Plan:  Plan for d/c to [facility] when bed available.  Family would like to pursue placement at [facility] for end of life care.  Her pain  medication should not be held due to concern for side effects or hypotension.  Reported pain during my visit with her today.  Notified RN that she needs prn medication.    Code Status:    Code Status Orders        Start     Ordered   06/17/16 1418  Do not attempt resuscitation (DNR)  Continuous    Question Answer Comment  In the event of cardiac or respiratory ARREST Do not call a "code blue"   In the event of cardiac or respiratory ARREST Do not perform Intubation, CPR, defibrillation or ACLS   In the event of cardiac or respiratory ARREST Use medication by any route, position, wound care, and other measures to relive pain and suffering. May use oxygen, suction and manual treatment of airway obstruction as needed for comfort.      06/17/16 1419    Code Status History    Date Active Date Inactive Code Status Order ID Comments User Context   06/17/2016 11:50 AM 06/17/2016  2:19 PM DNR [id number]  Jola Schmidt, MD ED   03/15/2013  4:38 AM 03/16/2013  7:03 PM DNR [id number]  Orvan Falconer, MD Inpatient    Advance Directive Documentation   Flowsheet Row Most Recent Value  Type of Advance Directive  Healthcare Power of Attorney  Pre-existing out of facility  DNR order (yellow form or pink MOST form)  No data  "MOST" Form in Place?  No data       Prognosis:   < 2 weeks.  Ms. Kivett is largely somnolent unless she is in pain.  She is not longer taking adequate nutrition and hydration.  She has a large wound that is likely cause of sepsis.  With focus on comfort, I anticipate she will continue to decline quickly and her prognosis will be less than 2 weeks.  Discharge Planning:  Hospice facility  Care plan was discussed with Dr. Wyline Copas, SW, and bedside RN.  Thank you for allowing the Palliative Medicine Team to assist in the care of this patient.   Total Time 15 Prolonged Time Billed No      Greater than 50%  of this time was spent counseling and coordinating care related to the above  assessment and plan.  Micheline Rough, MD  Please contact Palliative Medicine Team phone at [phone] for questions and concerns.

## 2016-06-20 NOTE — Progress Notes (Signed)
CSW updated by Hospice liason that patient can admit today to [facility].  Percell Locus Jannel Lynne LCSWA [phone]

## 2016-06-20 NOTE — Discharge Summary (Signed)
Physician Discharge Summary  [name] [id number] DOB: 11-16-1915 DOA: 06/17/2016  PCP: Rogers Blocker, MD  Admit date: 06/17/2016 Discharge date: 06/20/2016  Admitted From: Home Disposition:  Residential Hospice  Recommendations for Outpatient Follow-up:  1. Follow up with provider on as needed basis  Discharge Condition:Stable CODE STATUS:Comfort Diet recommendation: Comfort feeds   Brief/Interim Summary: 83 y.o.femalewith medical history significant forhypothyroidism, hypertension, diabetes, chronic congestive heart failure, breast cancer sensitivity emergency Department chief complaint of generalized weakness. Initial evaluation concerning for sepsis likely related to a large decubitus in sacral area  Information is obtained from the chart. Patient was brought to the emergency department approximately 3 weeks ago with the chief complaint of worsening decubitus ulcer generalized weakness nausea without vomiting. She is provided with oral antibiotics and discharged to home. Today she was transported to the emergency department via EMS from home with reports of worsening lethargy increased weakness decrease urine output. No reports of any nausea vomiting diarrhea constipation. No reports of fever chills. Patient denies pain shortness of breath.  #1. Decubitus ulcer/cellulitis. - Pt presented with chronic non-healing sacral decub ulcer with concerns for developing cellulitis - Pt had been on empiric IV abx per below - General Surgery was consulted. Given plans for more conservative management and focus on comfort and supportive measures. No plans for surgery - Surgery and WOC have signed off - Full comfort measures  #2. Generalized weakness/acute encephalopathy.  - Likely toxicmetabolic encephalopathy secondary to sacral decub -Patient has been continued on abx per above - Appreciate input by Palliative Care. Plans for hospice.  - Plans for transfer to residential hospice.  Prognosis is <2 weeks  3. Hypotension.  - Home medications include Coreg, Lasix, spironolactone - Continuing to hold bp meds for the time being secondary to hypotension at presentation - bp had remained stable  4. historyof CHF - appears dehydrated at this time - held diuretics - comfort care  Discharge Diagnoses:  Principal Problem:   Decubital ulcer Active Problems:   HTN (hypertension)   History of CHF (congestive heart failure)   Hypothyroidism   DNR no code (do not resuscitate)   Cellulitis   Hypotension   Acute encephalopathy   Decubitus ulcer of sacral region, unstageable ([place])   SIRS (systemic inflammatory response syndrome) (HCC)   Weakness   Goals of care, counseling/discussion   Palliative care encounter    Discharge Instructions   Allergies as of 06/20/2016      Reactions   Codeine Nausea And Vomiting   Penicillins Other (See Comments)   Has patient had a PCN reaction causing immediate rash, facial/tongue/throat swelling, SOB or lightheadedness with hypotension:unsure Has patient had a PCN reaction causing severe rash involving mucus membranes or skin necrosis:unsure Has patient had a PCN reaction that required hospitalization:unsure Has patient had a PCN reaction occurring within the last 10 years:unsure If all of the above answers are "NO", then may proceed with Cephalosporin use.      Medication List    STOP taking these medications   ALPRAZolam 0.5 MG tablet Commonly known as:  XANAX   carvedilol 3.125 MG tablet Commonly known as:  COREG   cholecalciferol 1000 units tablet Commonly known as:  VITAMIN D   docusate sodium 100 MG capsule Commonly known as:  COLACE   doxycycline 100 MG capsule Commonly known as:  VIBRAMYCIN   gabapentin 100 MG capsule Commonly known as:  NEURONTIN   HYDROcodone-acetaminophen 5-325 MG tablet Commonly known as:  NORCO/VICODIN   hydrOXYzine  10 MG tablet Commonly known as:  ATARAX/VISTARIL    levothyroxine 88 MCG tablet Commonly known as:  SYNTHROID, LEVOTHROID   metFORMIN 1000 MG tablet Commonly known as:  GLUCOPHAGE   naproxen 375 MG tablet Commonly known as:  NAPROSYN   PARoxetine 10 MG tablet Commonly known as:  PAXIL   silver sulfADIAZINE 1 % cream Commonly known as:  SILVADENE   simvastatin 40 MG tablet Commonly known as:  ZOCOR   spironolactone 25 MG tablet Commonly known as:  ALDACTONE   traMADol 50 MG tablet Commonly known as:  ULTRAM     TAKE these medications   furosemide 20 MG tablet Commonly known as:  LASIX Take 20 mg by mouth daily.   HYDROmorphone 2 MG/ML injection Commonly known as:  DILAUDID Inject 0.25 mLs (0.5 mg total) into the vein every 2 (two) hours as needed for severe pain.   mouth rinse Liqd solution 15 mLs by Mouth Rinse route 2 times daily at 12 noon and 4 pm.   promethazine 25 MG/ML injection Commonly known as:  PHENERGAN Inject 0.25 mLs (6.25 mg total) into the vein every 6 (six) hours as needed for nausea or vomiting.      Follow-up Information    Rogers Blocker, MD Follow up.   Specialty:  Internal Medicine Contact information: [place] [address] [phone]          Allergies  Allergen Reactions  . Codeine Nausea And Vomiting  . Penicillins Other (See Comments)    Has patient had a PCN reaction causing immediate rash, facial/tongue/throat swelling, SOB or lightheadedness with hypotension:unsure Has patient had a PCN reaction causing severe rash involving mucus membranes or skin necrosis:unsure Has patient had a PCN reaction that required hospitalization:unsure Has patient had a PCN reaction occurring within the last 10 years:unsure If all of the above answers are "NO", then may proceed with Cephalosporin use.     Consultations:  General Surgery  Palliative Care  Procedures/Studies: Dg Chest 1 View  Result Date: 06/17/2016 CLINICAL DATA:  Sacral decubitus ulcer.   Evaluation for sepsis. EXAM: CHEST 1 VIEW COMPARISON:  Chest radiograph 03/15/2013 FINDINGS: The heart size and mediastinal contours are within normal limits. Both lungs are clear. The visualized skeletal structures are unremarkable. IMPRESSION: No active disease. Electronically Signed   By: Ulyses Jarred M.D.   On: 06/17/2016 13:30   Dg Sacrum/coccyx  Result Date: 06/17/2016 CLINICAL DATA:  Sacral decubitus ulcer with infection and sepsis. EXAM: SACRUM AND COCCYX - 2+ VIEW COMPARISON:  CT scan 03/15/2013 FINDINGS: Extensive calcified injection granulomas noted in the like regions bilaterally the. Severe chronic right hip disease. The pubic symphysis and SI joints are grossly normal. The sacrum is grossly intact. Air noted in the soft tissues from the decubitus ulcer extending very close down to the sacrum. Moderate stool noted in the rectum. IMPRESSION: Deep decubitus ulcer extending down close to the sacrum. No obvious destructive bony changes to suggest osteomyelitis by plain films. Severe right hip joint degenerative changes. Numerous calcified injection granulomas bilaterally. Electronically Signed   By: Marijo Sanes M.D.   On: 06/17/2016 13:27    Subjective: No complaints  Discharge Exam: Vitals:   06/19/16 2232 06/20/16 0654  BP: (!) 96/57 120/73  Pulse: 94 91  Resp: 19 17  Temp: 97.6 F (36.4 C) (!) 96.7 F (35.9 C)   Vitals:   06/19/16 0618 06/19/16 1443 06/19/16 2232 06/20/16 0654  BP: (!) 93/59 (!) 95/52 Marland Kitchen)  96/57 120/73  Pulse: (!) 102 60 94 91  Resp: 18 16 19 17   Temp:  97.5 F (36.4 C) 97.6 F (36.4 C) (!) 96.7 F (35.9 C)  TempSrc:  Oral Axillary Axillary  SpO2: 100% 100% 98% 97%  Weight:      Height:        General: Pt is awake, not in acute distress Cardiovascular: RRR, S1/S2 +, no rubs, no gallops Respiratory: CTA bilaterally, no wheezing, no rhonchi Abdominal: Soft, NT, ND, bowel sounds + Extremities: no edema, no cyanosis   The results of significant  diagnostics from this hospitalization (including imaging, microbiology, ancillary and laboratory) are listed below for reference.     Microbiology: Recent Results (from the past 240 hour(s))  Culture, blood (x 2)     Status: None (Preliminary result)   Collection Time: 06/17/16 12:15 PM  Result Value Ref Range Status   Specimen Description BLOOD RIGHT ANTECUBITAL  Final   Special Requests BOTTLES DRAWN AEROBIC ONLY  10CC  Final   Culture NO GROWTH 2 DAYS  Final   Report Status PENDING  Incomplete  Culture, blood (x 2)     Status: None (Preliminary result)   Collection Time: 06/17/16 12:42 PM  Result Value Ref Range Status   Specimen Description BLOOD RIGHT HAND  Final   Special Requests IN PEDIATRIC BOTTLE 1CC  Final   Culture NO GROWTH 2 DAYS  Final   Report Status PENDING  Incomplete     Labs: BNP (last 3 results) No results for input(s): BNP in the last 8760 hours. Basic Metabolic Panel:  Recent Labs Lab 06/17/16 1212 06/18/16 0602 06/19/16 0012  NA 139 142 143  K 3.6 3.2* 2.8*  CL 102 111 112*  CO2 23 21* 21*  GLUCOSE 88 77 75  BUN 17 10 8   CREATININE 0.67 0.54 0.59  CALCIUM 9.2 8.2* 8.4*   Liver Function Tests:  Recent Labs Lab 06/17/16 1212  AST 37  ALT 30  ALKPHOS 136*  BILITOT 1.4*  PROT 5.7*  ALBUMIN 2.1*   No results for input(s): LIPASE, AMYLASE in the last 168 hours. No results for input(s): AMMONIA in the last 168 hours. CBC:  Recent Labs Lab 06/17/16 1212 06/18/16 0602 06/19/16 0012  WBC 10.8* 10.7* 10.1  NEUTROABS 9.3*  --   --   HGB 12.6 11.2* 11.5*  HCT 39.2 35.5* 36.8  MCV 96.1 96.7 97.4  PLT 321 286 256   Cardiac Enzymes:  Recent Labs Lab 06/17/16 1926  TROPONINI 0.07*   BNP: Invalid input(s): POCBNP CBG: No results for input(s): GLUCAP in the last 168 hours. D-Dimer No results for input(s): DDIMER in the last 72 hours. Hgb A1c No results for input(s): HGBA1C in the last 72 hours. Lipid Profile No results for  input(s): CHOL, HDL, LDLCALC, TRIG, CHOLHDL, LDLDIRECT in the last 72 hours. Thyroid function studies No results for input(s): TSH, T4TOTAL, T3FREE, THYROIDAB in the last 72 hours.  Invalid input(s): FREET3 Anemia work up  Recent Labs  06/17/16 1548  VITAMINB12 723   Urinalysis    Component Value Date/Time   COLORURINE YELLOW 03/15/2013 [place] 03/15/2013 0250   LABSPEC 1.019 03/15/2013 0250   PHURINE 6.0 03/15/2013 0250   GLUCOSEU NEGATIVE 03/15/2013 0250   HGBUR TRACE (A) 03/15/2013 0250   BILIRUBINUR SMALL (A) 03/15/2013 0250   KETONESUR 40 (A) 03/15/2013 0250   PROTEINUR 100 (A) 03/15/2013 0250   UROBILINOGEN 1.0 03/15/2013 0250   NITRITE NEGATIVE  03/15/2013 0250   LEUKOCYTESUR NEGATIVE 03/15/2013 0250   Sepsis Labs Invalid input(s): PROCALCITONIN,  WBC,  LACTICIDVEN Microbiology Recent Results (from the past 240 hour(s))  Culture, blood (x 2)     Status: None (Preliminary result)   Collection Time: 06/17/16 12:15 PM  Result Value Ref Range Status   Specimen Description BLOOD RIGHT ANTECUBITAL  Final   Special Requests BOTTLES DRAWN AEROBIC ONLY  10CC  Final   Culture NO GROWTH 2 DAYS  Final   Report Status PENDING  Incomplete  Culture, blood (x 2)     Status: None (Preliminary result)   Collection Time: 06/17/16 12:42 PM  Result Value Ref Range Status   Specimen Description BLOOD RIGHT HAND  Final   Special Requests IN PEDIATRIC BOTTLE 1CC  Final   Culture NO GROWTH 2 DAYS  Final   Report Status PENDING  Incomplete     SIGNED:   Colman Birdwell, Orpah Melter, MD  [facility] 06/20/2016, 11:51 AM  If 7PM-7AM, please contact night-coverage [email or web address] Password [email or web address]

## 2016-06-20 NOTE — Consult Note (Signed)
[facility] available today. Met with patient's daughter and son to complete paper work for transfer today. Dr. Orpah Melter to assume care per family preference.  DC Summary has been faxed.   RN please call report to [phone].  Thank you,  Erling Conte, LCSW [phone]

## 2016-06-20 NOTE — Progress Notes (Signed)
CSW following for residential hospice placement.  [name] LCSWA [phone]

## 2016-06-22 LAB — CULTURE, BLOOD (ROUTINE X 2)
Culture: NO GROWTH
Culture: NO GROWTH

## 2016-07-20 DEATH — deceased
